# Patient Record
Sex: Female | Born: 2000 | Hispanic: Yes | Marital: Married | Smoking: Never smoker
Health system: Southern US, Community
[De-identification: ages and names within clinical notes are randomized; demographics above are authoritative.]

## PROBLEM LIST (undated history)

## (undated) ENCOUNTER — Ambulatory Visit: Admission: EM | Source: Home / Self Care

## (undated) DIAGNOSIS — Z789 Other specified health status: Secondary | ICD-10-CM

## (undated) DIAGNOSIS — G43909 Migraine, unspecified, not intractable, without status migrainosus: Secondary | ICD-10-CM

## (undated) DIAGNOSIS — N83209 Unspecified ovarian cyst, unspecified side: Secondary | ICD-10-CM

## (undated) HISTORY — PX: NO PAST SURGERIES: SHX2092

## (undated) HISTORY — PX: OTHER SURGICAL HISTORY: SHX169

## (undated) HISTORY — DX: Other specified health status: Z78.9

## (undated) HISTORY — DX: Migraine, unspecified, not intractable, without status migrainosus: G43.909

---
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
## 2019-04-23 ENCOUNTER — Other Ambulatory Visit: Payer: Self-pay

## 2019-04-23 DIAGNOSIS — Z20828 Contact with and (suspected) exposure to other viral communicable diseases: Secondary | ICD-10-CM

## 2019-04-23 DIAGNOSIS — Z20822 Contact with and (suspected) exposure to covid-19: Secondary | ICD-10-CM

## 2019-04-25 ENCOUNTER — Telehealth: Payer: Self-pay | Admitting: General Practice

## 2019-04-25 LAB — NOVEL CORONAVIRUS, NAA: SARS-CoV-2, NAA: NOT DETECTED

## 2019-04-25 NOTE — Telephone Encounter (Signed)
Negative COVID results given. Patient results "NOT Detected." Caller expressed understanding. ° °

## 2019-04-26 ENCOUNTER — Telehealth: Payer: Self-pay | Admitting: General Practice

## 2019-04-26 NOTE — Telephone Encounter (Signed)
°  Neg COVID results given to pt  °

## 2019-08-13 ENCOUNTER — Other Ambulatory Visit
Admission: RE | Admit: 2019-08-13 | Discharge: 2019-08-13 | Disposition: A | Discharge status: Home / Self Care | Payer: Medicaid Other | Source: Ambulatory Visit | Attending: Pediatrics | Admitting: Pediatrics

## 2019-08-13 DIAGNOSIS — L659 Nonscarring hair loss, unspecified: Secondary | ICD-10-CM | POA: Diagnosis present

## 2019-08-13 LAB — CBC WITH DIFFERENTIAL/PLATELET
Abs Immature Granulocytes: 0.02 10*3/uL (ref 0.00–0.07)
Basophils Absolute: 0.1 10*3/uL (ref 0.0–0.1)
Basophils Relative: 1 %
Eosinophils Absolute: 0.1 10*3/uL (ref 0.0–0.5)
Eosinophils Relative: 2 %
HCT: 33.6 % — ABNORMAL LOW (ref 36.0–46.0)
Hemoglobin: 10.4 g/dL — ABNORMAL LOW (ref 12.0–15.0)
Immature Granulocytes: 0 %
Lymphocytes Relative: 26 %
Lymphs Abs: 2 10*3/uL (ref 0.7–4.0)
MCH: 26 pg (ref 26.0–34.0)
MCHC: 31 g/dL (ref 30.0–36.0)
MCV: 84 fL (ref 80.0–100.0)
Monocytes Absolute: 0.6 10*3/uL (ref 0.1–1.0)
Monocytes Relative: 8 %
Neutro Abs: 4.8 10*3/uL (ref 1.7–7.7)
Neutrophils Relative %: 63 %
Platelets: 306 10*3/uL (ref 150–400)
RBC: 4 MIL/uL (ref 3.87–5.11)
RDW: 15.2 % (ref 11.5–15.5)
WBC: 7.5 10*3/uL (ref 4.0–10.5)
nRBC: 0 % (ref 0.0–0.2)

## 2019-08-13 LAB — IRON AND TIBC
Iron: 71 ug/dL (ref 28–170)
Saturation Ratios: 16 % (ref 10.4–31.8)
TIBC: 449 ug/dL (ref 250–450)
UIBC: 378 ug/dL

## 2019-08-13 LAB — TSH: TSH: 0.715 u[IU]/mL (ref 0.350–4.500)

## 2019-08-13 LAB — FERRITIN: Ferritin: 3 ng/mL — ABNORMAL LOW (ref 11–307)

## 2019-10-08 ENCOUNTER — Encounter: Payer: Self-pay | Admitting: Certified Nurse Midwife

## 2019-10-08 ENCOUNTER — Other Ambulatory Visit: Payer: Self-pay

## 2019-10-08 ENCOUNTER — Ambulatory Visit (INDEPENDENT_AMBULATORY_CARE_PROVIDER_SITE_OTHER): Payer: Medicaid Other | Admitting: Certified Nurse Midwife

## 2019-10-08 VITALS — BP 110/72 | HR 84 | Ht 64.0 in | Wt 140.0 lb

## 2019-10-08 DIAGNOSIS — Z30016 Encounter for initial prescription of transdermal patch hormonal contraceptive device: Secondary | ICD-10-CM | POA: Diagnosis not present

## 2019-10-08 LAB — POCT URINE PREGNANCY: Preg Test, Ur: NEGATIVE

## 2019-10-08 MED ORDER — XULANE 150-35 MCG/24HR TD PTWK: 1.0000 | MEDICATED_PATCH | TRANSDERMAL | 4 refills | Status: DC

## 2019-10-08 NOTE — Patient Instructions (Signed)
Preventive Care 18-21 Years Old, Female Preventive care refers to lifestyle choices and visits with your health care provider that can promote health and wellness. At this stage in your life, you may start seeing a primary care physician instead of a pediatrician. Your health care is now your responsibility. Preventive care for young adults includes:  A yearly physical exam. This is also called an annual wellness visit.  Regular dental and eye exams.  Immunizations.  Screening for certain conditions.  Healthy lifestyle choices, such as diet and exercise. What can I expect for my preventive care visit? Physical exam Your health care provider may check:  Height and weight. These may be used to calculate body mass index (BMI), which is a measurement that tells if you are at a healthy weight.  Heart rate and blood pressure.  Body temperature. Counseling Your health care provider may ask you questions about:  Past medical problems and family medical history.  Alcohol, tobacco, and drug use.  Home and relationship well-being.  Access to firearms.  Emotional well-being.  Diet, exercise, and sleep habits.  Sexual activity and sexual health.  Method of birth control.  Menstrual cycle.  Pregnancy history. What immunizations do I need?  Influenza (flu) vaccine  This is recommended every year. Tetanus, diphtheria, and pertussis (Tdap) vaccine  You may need a Td booster every 10 years. Varicella (chickenpox) vaccine  You may need this vaccine if you have not already been vaccinated. Human papillomavirus (HPV) vaccine  If recommended by your health care provider, you may need three doses over 6 months. Measles, mumps, and rubella (MMR) vaccine  You may need at least one dose of MMR. You may also need a second dose. Meningococcal conjugate (MenACWY) vaccine  One dose is recommended if you are 19-21 years old and a first-year college student living in a residence hall,  or if you have one of several medical conditions. You may also need additional booster doses. Pneumococcal conjugate (PCV13) vaccine  You may need this if you have certain conditions and were not previously vaccinated. Pneumococcal polysaccharide (PPSV23) vaccine  You may need one or two doses if you smoke cigarettes or if you have certain conditions. Hepatitis A vaccine  You may need this if you have certain conditions or if you travel or work in places where you may be exposed to hepatitis A. Hepatitis B vaccine  You may need this if you have certain conditions or if you travel or work in places where you may be exposed to hepatitis B. Haemophilus influenzae type b (Hib) vaccine  You may need this if you have certain risk factors. You may receive vaccines as individual doses or as more than one vaccine together in one shot (combination vaccines). Talk with your health care provider about the risks and benefits of combination vaccines. What tests do I need? Blood tests  Lipid and cholesterol levels. These may be checked every 5 years starting at age 20.  Hepatitis C test.  Hepatitis B test. Screening  Pelvic exam and Pap test. This may be done every 3 years starting at age 21.  Sexually transmitted disease (STD) testing, if you are at risk.  BRCA-related cancer screening. This may be done if you have a family history of breast, ovarian, tubal, or peritoneal cancers. Other tests  Tuberculosis skin test.  Vision and hearing tests.  Skin exam.  Breast exam. Follow these instructions at home: Eating and drinking   Eat a diet that includes fresh fruits and   vegetables, whole grains, lean protein, and low-fat dairy products.  Drink enough fluid to keep your urine pale yellow.  Do not drink alcohol if: ? Your health care provider tells you not to drink. ? You are pregnant, may be pregnant, or are planning to become pregnant. ? You are under the legal drinking age. In the  [place] the legal drinking age is 36.  If you drink alcohol: ? Limit how much you have to 0-1 drink a day. ? Be aware of how much alcohol is in your drink. In the [place] one drink equals one 12 oz bottle of beer (355 mL), one 5 oz glass of wine (148 mL), or one 1 oz glass of hard liquor (44 mL). Lifestyle  Take daily care of your teeth and gums.  Stay active. Exercise at least 30 minutes 5 or more days of the week.  Do not use any products that contain nicotine or tobacco, such as cigarettes, e-cigarettes, and chewing tobacco. If you need help quitting, ask your health care provider.  Do not use drugs.  If you are sexually active, practice safe sex. Use a condom or other form of birth control (contraception) in order to prevent pregnancy and STIs (sexually transmitted infections). If you plan to become pregnant, see your health care provider for a pre-conception visit.  Find healthy ways to cope with stress, such as: ? Meditation, yoga, or listening to music. ? Journaling. ? Talking to a trusted person. ? Spending time with friends and family. Safety  Always wear your seat belt while driving or riding in a vehicle.  Do not drive if you have been drinking alcohol. Do not ride with someone who has been drinking.  Do not drive when you are tired or distracted. Do not text while driving.  Wear a helmet and other protective equipment during sports activities.  If you have firearms in your house, make sure you follow all gun safety procedures.  Seek help if you have been bullied, physically abused, or sexually abused.  Use the Internet responsibly to avoid dangers such as online bullying and online sex predators. What's next?  Go to your health care provider once a year for a well check visit.  Ask your health care provider how often you should have your eyes and teeth checked.  Stay up to date on all vaccines. This information is not intended to replace advice given to you by  your health care provider. Make sure you discuss any questions you have with your health care provider. Document Revised: 06/29/2018 Document Reviewed: 06/29/2018 Elsevier Patient Education  [place].   Ethinyl Estradiol; Norelgestromin skin patches What is this medicine? ETHINYL ESTRADIOL;NORELGESTROMIN (ETH in il es tra DYE ole; nor el JES troe min) skin patch is used as a contraceptive (birth control method). This medicine combines two types of female hormones, an estrogen and a progestin. This patch is used to prevent ovulation and pregnancy. This medicine may be used for other purposes; ask your health care provider or pharmacist if you have questions. COMMON BRAND NAME(S): Ortho Becky Sax What should I tell my health care provider before I take this medicine? They need to know if you have or ever had any of these conditions:  abnormal vaginal bleeding  blood vessel disease or blood clots  breast, cervical, endometrial, ovarian, liver, or uterine cancer  diabetes  gallbladder disease  having surgery  heart disease or recent heart attack  high blood pressure  high cholesterol or triglycerides  history of irregular heartbeat or heart valve problems  kidney disease  liver disease  migraine headaches  protein C deficiency  protein S deficiency  recently had a baby, miscarriage, or abortion  stroke  systemic lupus erythematosus (SLE)  tobacco smoker  an unusual or allergic reaction to estrogens, progestins, other medicines, foods, dyes, or preservatives  pregnant or trying to get pregnant  breast-feeding How should I use this medicine? This patch is applied to the skin. Follow the directions on the prescription label. Apply to clean, dry, healthy skin on the buttock, abdomen, upper outer arm or upper torso, in a place where it will not be rubbed by tight clothing. Do not use lotions or other cosmetics on the site where the patch will go. Press the  patch firmly in place for 10 seconds to ensure good contact with the skin. Change the patch every 7 days on the same day of the week for 3 weeks. You will then have a break from the patch for 1 week, after which you will apply a new patch. Do not use your medicine more often than directed. Contact your pediatrician regarding the use of this medicine in children. Special care may be needed. This medicine has been used in female children who have started having menstrual periods. A patient package insert for the product will be given with each prescription and refill. Read this sheet carefully each time. The sheet may change frequently. Overdosage: If you think you have taken too much of this medicine contact a poison control center or emergency room at once. NOTE: This medicine is only for you. Do not share this medicine with others. What if I miss a dose? You will need to replace your patch once a week as directed. If your patch is lost or falls off, contact your health care professional for advice. You may need to use another form of birth control if your patch has been off for more than 1 day. What may interact with this medicine? Do not take this medicine with the following medications:  dasabuvir; ombitasvir; paritaprevir; ritonavir  ombitasvir; paritaprevir; ritonavir This medicine may also interact with the following medications:  acetaminophen  antibiotics or medicines for infections, especially rifampin, rifabutin, rifapentine, and possibly penicillins or tetracyclines  aprepitant or fosaprepitant  armodafinil  ascorbic acid (vitamin C)  barbiturate medicines, such as phenobarbital or primidone  bosentan  certain antiviral medicines for hepatitis, HIV or AIDS  certain medicines for cancer treatment  certain medicines for seizures like carbamazepine, clobazam, felbamate, lamotrigine, oxcarbazepine, phenytoin, rufinamide, topiramate  certain medicines for treating high  cholesterol  cyclosporine  dantrolene  elagolix  flibanserin  grapefruit juice  lesinurad  medicines for diabetes  medicines to treat fungal infections, such as griseofulvin, miconazole, fluconazole, ketoconazole, itraconazole, posaconazole or voriconazole  mifepristone  mitotane  modafinil  morphine  mycophenolate  St. John's wort  tamoxifen  temazepam  theophylline or aminophylline  thyroid hormones  tizanidine  tranexamic acid  ulipristal  warfarin This list may not describe all possible interactions. Give your health care provider a list of all the medicines, herbs, non-prescription drugs, or dietary supplements you use. Also tell them if you smoke, drink alcohol, or use illegal drugs. Some items may interact with your medicine. What should I watch for while using this medicine? Visit your doctor or health care professional for regular checks on your progress. You will need a regular breast and pelvic exam and Pap smear while on this medicine. Use an additional  method of contraception during the first cycle that you use this patch. If you have any reason to think you are pregnant, stop using this medicine right away and contact your doctor or health care professional. If you are using this medicine for hormone related problems, it may take several cycles of use to see improvement in your condition. Smoking increases the risk of getting a blood clot or having a stroke while you are using hormonal birth control, especially if you are more than 20 years old. You are strongly advised not to smoke. This medicine can make your body retain fluid, making your fingers, hands, or ankles swell. Your blood pressure can go up. Contact your doctor or health care professional if you feel you are retaining fluid. This medicine can make you more sensitive to the sun. Keep out of the sun. If you cannot avoid being in the sun, wear protective clothing and use sunscreen. Do not  use sun lamps or tanning beds/booths. If you wear contact lenses and notice visual changes, or if the lenses begin to feel uncomfortable, consult your eye care specialist. In some women, tenderness, swelling, or minor bleeding of the gums may occur. Notify your dentist if this happens. Brushing and flossing your teeth regularly may help limit this. See your dentist regularly and inform your dentist of the medicines you are taking. If you are going to have elective surgery or a MRI, you may need to stop using this medicine before the surgery or MRI. Consult your health care professional for advice. This medicine does not protect you against HIV infection (AIDS) or any other sexually transmitted diseases. What side effects may I notice from receiving this medicine? Side effects that you should report to your doctor or health care professional as soon as possible:  allergic reactions such as skin rash or itching, hives, swelling of the lips, mouth, tongue, or throat  breast tissue changes or discharge  dark patches of skin on your forehead, cheeks, upper lip, and chin  depression  high blood pressure  migraines or severe, sudden headaches  missed menstrual periods  signs and symptoms of a blood clot such as breathing problems; changes in vision; chest pain; severe, sudden headache; pain, swelling, warmth in the leg; trouble speaking; sudden numbness or weakness of the face, arm or leg  skin reactions at the patch site such as blistering, bleeding, itching, rash, or swelling  stomach pain  yellowing of the eyes or skin Side effects that usually do not require medical attention (report these to your doctor or health care professional if they continue or are bothersome):  breast tenderness  irregular vaginal bleeding or spotting, particularly during the first 3 months of use  headache  nausea  painful menstrual periods  skin redness or mild irritation at site where  applied  weight gain (slight) This list may not describe all possible side effects. Call your doctor for medical advice about side effects. You may report side effects to FDA at 1-800-FDA-1088. Where should I keep my medicine? Keep out of the reach of children. Store at room temperature between 15 and 30 degrees C (59 and 86 degrees F). Keep the patch in its pouch until time of use. Throw away any unused medicine after the expiration date. Dispose of used patches properly. Since a used patch may still contain active hormones, fold the patch in half so that it sticks to itself prior to disposal. Throw away in a place where children or pets cannot reach.  NOTE: This sheet is a summary. It may not cover all possible information. If you have questions about this medicine, talk to your doctor, pharmacist, or health care provider.  2020 Elsevier/Gold Standard (2018-10-10 11:56:29)

## 2019-10-08 NOTE — Progress Notes (Signed)
NP-Pt present to discuss birth control. Pt stated that she would like to start OCP. Pt is currently sexually activity and using condoms for birth control. Pt LMP 09/27/2019. UPT- NEG.

## 2019-10-08 NOTE — Progress Notes (Signed)
GYN ENCOUNTER NOTE  Subjective:       Jenalyn Girdner is a 19 y.o. G0P0000 female here for contraception counseling.  Desires pregnancy prevention and period management.   History significant for tension like headaches followed by [facility].   Denies difficulty breathing or respiratory distress, chest pain, abdominal pain, excessive vaginal bleeding, dysuria, and leg pain or swelling.      Gynecologic History  Patient's last menstrual period was 09/27/2019. Period Cycle (Days): 28 Period Duration (Days): 4 Period Pattern: Regular Menstrual Flow: Moderate Menstrual Control: Maxi pad Dysmenorrhea: (!) Mild Dysmenorrhea Symptoms: Cramping  Contraception: condoms   Last Pap:N/A  Obstetric History  OB History  Gravida Para Term Preterm AB Living  0 0 0 0 0 0  SAB TAB Ectopic Multiple Live Births  0 0 0 0 0    Past Medical History:  Diagnosis Date  . Migraines     No Known Allergies  Social History   Socioeconomic History  . Marital status: Single    Spouse name: Not on file  . Number of children: Not on file  . Years of education: Not on file  . Highest education level: Not on file  Occupational History  . Not on file  Tobacco Use  . Smoking status: Never Smoker  . Smokeless tobacco: Never Used  Substance and Sexual Activity  . Alcohol use: Never  . Drug use: Never  . Sexual activity: Yes    Birth control/protection: Condom  Other Topics Concern  . Not on file  Social History Narrative  . Not on file   Social Determinants of Health   Financial Resource Strain:   . Difficulty of Paying Living Expenses:   Food Insecurity:   . Worried About Charity fundraiser in the Last Year:   . Arboriculturist in the Last Year:   Transportation Needs:   . Film/video editor (Medical):   Marland Kitchen Lack of Transportation (Non-Medical):   Physical Activity:   . Days of Exercise per Week:   . Minutes of Exercise per Session:   Stress:   . Feeling of Stress :    Social Connections:   . Frequency of Communication with Friends and Family:   . Frequency of Social Gatherings with Friends and Family:   . Attends Religious Services:   . Active Member of Clubs or Organizations:   . Attends Archivist Meetings:   Marland Kitchen Marital Status:   Intimate Partner Violence:   . Fear of Current or Ex-Partner:   . Emotionally Abused:   Marland Kitchen Physically Abused:   . Sexually Abused:     Family History  Problem Relation Age of Onset  . Migraines Mother   . Anxiety disorder Father   . Migraines Maternal Grandmother   . Healthy Paternal Grandmother   . Healthy Paternal Grandfather     The following portions of the patient's history were reviewed and updated as appropriate: allergies, current medications, past family history, past medical history, past social history, past surgical history and problem list.  Review of Systems  ROS negative except as noted above. Information obtained from patient.   Objective:   BP 110/72   Pulse 84   Ht 5\' 4"  (1.626 m)   Wt 140 lb (63.5 kg)   LMP 09/27/2019   BMI 24.03 kg/m    CONSTITUTIONAL: Well-developed, well-nourished female in no acute distress.   PHYSICAL EXAM: Not indicated.   POCT urine pregnancy     Status:  None   Collection Time: 10/08/19 10:25 AM  Result Value Ref Range   Preg Test, Ur Negative Negative    Assessment:   1. Encounter for initial prescription of transdermal patch hormonal contraceptive device  - POCT urine pregnancy     Plan:   Reviewed all forms of birth control options available including abstinence; fertility period awareness methods; over the counter/barrier methods; hormonal contraceptive medication including pill, patch, ring, injection,contraceptive implant; hormonal and nonhormonal IUDs; permanent sterilization options including vasectomy and the various tubal sterilization modalities. Risks and benefits reviewed.  Questions were answered.  Information was given to  patient to review.   Rx [facility], see orders.   Reviewed red flag symptoms and when to call.   RTC x 1 year for ANNUAL EXAM or sooner if needed.    Gunnar Bulla, CNM [facility] 10/08/19 11:24 AM    A total of 20 minutes were spent face-to-face with the patient during this encounter and over half of that time involved counseling and coordination of care.

## 2020-01-28 ENCOUNTER — Ambulatory Visit
Admission: EM | Admit: 2020-01-28 | Discharge: 2020-01-28 | Disposition: A | Discharge status: Home / Self Care | Payer: Medicaid Other | Attending: Family Medicine | Admitting: Family Medicine

## 2020-01-28 ENCOUNTER — Other Ambulatory Visit: Payer: Self-pay

## 2020-01-28 DIAGNOSIS — R5383 Other fatigue: Secondary | ICD-10-CM | POA: Insufficient documentation

## 2020-01-28 LAB — CBC WITH DIFFERENTIAL/PLATELET
Abs Immature Granulocytes: 0.02 10*3/uL (ref 0.00–0.07)
Basophils Absolute: 0 10*3/uL (ref 0.0–0.1)
Basophils Relative: 0 %
Eosinophils Absolute: 0.1 10*3/uL (ref 0.0–0.5)
Eosinophils Relative: 2 %
HCT: 35.3 % — ABNORMAL LOW (ref 36.0–46.0)
Hemoglobin: 11.7 g/dL — ABNORMAL LOW (ref 12.0–15.0)
Immature Granulocytes: 0 %
Lymphocytes Relative: 16 %
Lymphs Abs: 1.1 10*3/uL (ref 0.7–4.0)
MCH: 27.3 pg (ref 26.0–34.0)
MCHC: 33.1 g/dL (ref 30.0–36.0)
MCV: 82.5 fL (ref 80.0–100.0)
Monocytes Absolute: 0.6 10*3/uL (ref 0.1–1.0)
Monocytes Relative: 9 %
Neutro Abs: 5 10*3/uL (ref 1.7–7.7)
Neutrophils Relative %: 73 %
Platelets: 270 10*3/uL (ref 150–400)
RBC: 4.28 MIL/uL (ref 3.87–5.11)
RDW: 15.8 % — ABNORMAL HIGH (ref 11.5–15.5)
WBC: 6.9 10*3/uL (ref 4.0–10.5)
nRBC: 0 % (ref 0.0–0.2)

## 2020-01-28 LAB — URINALYSIS, COMPLETE (UACMP) WITH MICROSCOPIC
Bilirubin Urine: NEGATIVE
Glucose, UA: NEGATIVE mg/dL
Ketones, ur: NEGATIVE mg/dL
Leukocytes,Ua: NEGATIVE
Nitrite: NEGATIVE
Protein, ur: NEGATIVE mg/dL
Specific Gravity, Urine: >1.03 — ABNORMAL HIGH (ref 1.005–1.030)
pH: 6.5 (ref 5.0–8.0)

## 2020-01-28 LAB — PREGNANCY, URINE: Preg Test, Ur: NEGATIVE

## 2020-01-28 LAB — BASIC METABOLIC PANEL
Anion gap: 7 (ref 5–15)
BUN: 10 mg/dL (ref 6–20)
CO2: 25 mmol/L (ref 22–32)
Calcium: 8.9 mg/dL (ref 8.9–10.3)
Chloride: 104 mmol/L (ref 98–111)
Creatinine, Ser: 0.6 mg/dL (ref 0.44–1.00)
GFR calc Af Amer: >60 mL/min (ref >60)
GFR calc non Af Amer: >60 mL/min (ref >60)
Glucose, Bld: 95 mg/dL (ref 70–99)
Potassium: 4 mmol/L (ref 3.5–5.1)
Sodium: 136 mmol/L (ref 135–145)

## 2020-01-28 LAB — TSH: TSH: 0.717 u[IU]/mL (ref 0.350–4.500)

## 2020-01-28 NOTE — ED Triage Notes (Signed)
Pt states for past week she has felt run-down and tired. Not feeling sick.

## 2020-01-28 NOTE — ED Provider Notes (Signed)
[facility] ____________________________________________  Time seen: Approximately 11:04 AM  I have reviewed the triage vital signs and the nursing notes.   HISTORY  Chief Complaint Fatigue   HPI Claudia Frederick is a 19 y.o. female presenting  for evaluation evaluation of fatigue.  Patient reports for the last 1 week she has been feeling tired and rundown.  Patient does report she has had a lot of stress this past week, particularly with work.  Denies any suicidal homicidal ideations.  Continues eat and drink well.  Denies recent sickness.  Denies cough, congestion, sore throat, vomiting, diarrhea, chest pain or shortness of breath, abdominal pain.  Denies sudden abrupt weight loss, but does report she has lost 11 pounds in the last 3 months with working more.  Recently had left arm from an implant placed, denies pregnancy.  Denies dysuria.  Patient further states she had this placed in May, and requested a if it could be removed.  Patient again states at this time she does not have any pain and feels overall well just tired.  Corky Downs, NP : PCP Patient's last menstrual period was 11/28/2019.   Past Medical History:  Diagnosis Date   Migraines     There are no problems to display for this patient.   Past Surgical History:  Procedure Laterality Date   no surgical history       No current facility-administered medications for this encounter.  Current Outpatient Medications:    norelgestromin-ethinyl estradiol [employer]) 150-35 MCG/24HR transdermal patch, Place 1 patch onto the skin once a week., Disp: 9 patch, Rfl: 4  Allergies Patient has no known allergies.  Family History  Problem Relation Age of Onset   Migraines Mother    Anxiety disorder Father    Migraines Maternal Grandmother    Healthy Paternal Grandmother    Healthy Paternal Grandfather     Social History Social History   Tobacco Use   Smoking status: Never Smoker    Smokeless tobacco: Never Used  Building services engineer Use: Never used  Substance Use Topics   Alcohol use: Never   Drug use: Never    Review of Systems Constitutional: No fever ENT: No sore throat. Cardiovascular: Denies chest pain. Respiratory: Denies shortness of breath. Gastrointestinal: No abdominal pain.  No nausea, no vomiting.  No diarrhea.  No constipation. Genitourinary: Negative for dysuria. Musculoskeletal: Negative for back pain. Skin: Negative for rash. Neurological: Negative for headaches, focal weakness or numbness.   ____________________________________________   PHYSICAL EXAM:  VITAL SIGNS: ED Triage Vitals  Enc Vitals Group     BP 01/28/20 1026 113/81     Pulse Rate 01/28/20 1026 73     Resp 01/28/20 1026 16     Temp 01/28/20 1026 98.2 F (36.8 C)     Temp Source 01/28/20 1026 Oral     SpO2 01/28/20 1026 100 %     Weight 01/28/20 1025 129 lb 6.4 oz (58.7 kg)     Height 01/28/20 1025 5\' 3"  (1.6 m)     Head Circumference --      Peak Flow --      Pain Score 01/28/20 1025 0     Pain Loc --      Pain Edu? --      Excl. in GC? --     Constitutional: Alert and oriented. Well appearing and in no acute distress. Eyes: Conjunctivae are normal.  ENT      Head: Normocephalic and atraumatic.  Neck: No stridor. Supple without meningismus.  Hematological/Lymphatic/Immunilogical: No cervical lymphadenopathy. Cardiovascular: Normal rate, regular rhythm. Grossly normal heart sounds.  Good peripheral circulation. Respiratory: Normal respiratory effort without tachypnea nor retractions. Breath sounds are clear and equal bilaterally. No wheezes, rales, rhonchi. Gastrointestinal: Soft and nontender.  Musculoskeletal: Steady gait. Neurologic:  Normal speech and language. Speech is normal. No gait instability.  Skin:  Skin is warm, dry and intact. No rash noted. Psychiatric: Mood and affect are normal. Speech and behavior are normal. Patient exhibits appropriate  insight and judgment   ___________________________________________   LABS (all labs ordered are listed, but only abnormal results are displayed)  Labs Reviewed  URINALYSIS, COMPLETE (UACMP) WITH MICROSCOPIC - Abnormal; Notable for the following components:      Result Value   Specific Gravity, Urine >1.030 (*)    Hgb urine dipstick SMALL (*)    Bacteria, UA RARE (*)    All other components within normal limits  CBC WITH DIFFERENTIAL/PLATELET - Abnormal; Notable for the following components:   Hemoglobin 11.7 (*)    HCT 35.3 (*)    RDW 15.8 (*)    All other components within normal limits  PREGNANCY, URINE  BASIC METABOLIC PANEL  TSH   ____________________________________________    PROCEDURES  INITIAL IMPRESSION / ASSESSMENT AND PLAN / ED COURSE  Pertinent labs & imaging results that were available during my care of the patient were reviewed by me and considered in my medical decision making (see chart for details).  Well-appearing patient.  No acute distress.  Labs as above reviewed, and discussed with patient.  Encourage patient to take regular multivitamin with iron.  Suspect patient recent stress relating to her fatigue.  Encourage close primary care follow-up.  Supportive care.  Discussed follow up with Primary care physician this week. Discussed follow up and return parameters including no resolution or any worsening concerns. Patient verbalized understanding and agreed to plan.   ____________________________________________   FINAL CLINICAL IMPRESSION(S) / ED DIAGNOSES  Final diagnoses:  Fatigue, unspecified type     ED Discharge Orders    None       Note: This dictation was prepared with Dragon dictation along with smaller phrase technology. Any transcriptional errors that result from this process are unintentional.      a   Renford Dills, NP 01/28/20 1236

## 2020-01-28 NOTE — Discharge Instructions (Addendum)
Rest. Drink plenty of fluids. Avoid stress as able.   Follow up with your primary care physician this week.  Return to Urgent care for new or worsening concerns.

## 2020-02-25 ENCOUNTER — Ambulatory Visit
Admission: EM | Admit: 2020-02-25 | Discharge: 2020-02-25 | Disposition: A | Discharge status: Home / Self Care | Payer: Medicaid Other | Attending: Family Medicine | Admitting: Family Medicine

## 2020-02-25 ENCOUNTER — Other Ambulatory Visit: Payer: Self-pay

## 2020-02-25 DIAGNOSIS — R1013 Epigastric pain: Secondary | ICD-10-CM | POA: Diagnosis not present

## 2020-02-25 MED ORDER — PANTOPRAZOLE SODIUM 20 MG PO TBEC
40.0000 mg | DELAYED_RELEASE_TABLET | Freq: Every day | ORAL | 1 refills | Status: DC
Start: 2020-02-25 — End: 2020-03-17

## 2020-02-25 NOTE — ED Triage Notes (Signed)
Pt with mid/upper abdominal pain for past 2 days always after she eats. Usually takes an hour to go a way. No n/v/d.

## 2020-02-25 NOTE — Discharge Instructions (Addendum)
Medication as prescribed.  Healthy diet.   If persists, will need to see GI.  Take care  Dr. Adriana Simas

## 2020-02-25 NOTE — ED Provider Notes (Signed)
[facility]    CSN: [id number] Arrival date & time: 02/25/20  1553      History   Chief Complaint Chief Complaint  Patient presents with  . Abdominal Pain   HPI   19 year old female presents with upper abdominal pain.  2-day history of upper abdominal pain.  Pain 3/10 in severity.  Worse after eating.  Denies any nausea, vomiting, diarrhea.  Denies heartburn.  States that her last menstrual period was in May but she has a Nexplanon.  Seems to improve approximately 1 hour after eating.  No right upper quadrant pain.  No relieving factors.  No medications or interventions tried.  No other associated symptoms.  No other complaints.  Past Medical History:  Diagnosis Date  . Migraines    Past Surgical History:  Procedure Laterality Date  . no surgical history      OB History    Gravida  0   Para  0   Term  0   Preterm  0   AB  0   Living  0     SAB  0   TAB  0   Ectopic  0   Multiple  0   Live Births  0            Home Medications    Prior to Admission medications   Medication Sig Start Date End Date Taking? Authorizing Provider  etonogestrel (NEXPLANON) 68 MG IMPL implant 1 each by Subdermal route once.   Yes [provider]  pantoprazole (PROTONIX) 20 MG tablet Take 2 tablets (40 mg total) by mouth daily. 02/25/20   Tommie Sams, DO  norelgestromin-ethinyl estradiol [employer]) 150-35 MCG/24HR transdermal patch Place 1 patch onto the skin once a week. 10/08/19 02/25/20  Gunnar Bulla, CNM    Family History Family History  Problem Relation Age of Onset  . Migraines Mother   . Anxiety disorder Father   . Migraines Maternal Grandmother   . Healthy Paternal Grandmother   . Healthy Paternal Grandfather     Social History Social History   Tobacco Use  . Smoking status: Never Smoker  . Smokeless tobacco: Never Used  Vaping Use  . Vaping Use: Never used  Substance Use Topics  . Alcohol use: Never  . Drug use: Never      Allergies   Patient has no known allergies.   Review of Systems Review of Systems  Constitutional: Negative for fever.  Gastrointestinal: Positive for abdominal pain. Negative for diarrhea, nausea and vomiting.   Physical Exam Triage Vital Signs ED Triage Vitals  Enc Vitals Group     BP 02/25/20 1634 108/75     Pulse Rate 02/25/20 1634 85     Resp 02/25/20 1634 16     Temp 02/25/20 1634 98.2 F (36.8 C)     Temp Source 02/25/20 1634 Oral     SpO2 02/25/20 1634 100 %     Weight 02/25/20 1633 129 lb 3 oz (58.6 kg)     Height 02/25/20 1633 5\' 3"  (1.6 m)     Head Circumference --      Peak Flow --      Pain Score 02/25/20 1633 3     Pain Loc --      Pain Edu? --      Excl. in GC? --    Updated Vital Signs BP 108/75 (BP Location: Left Arm)   Pulse 85   Temp 98.2 F (36.8 C) (Oral)  Resp 16   Ht 5\' 3"  (1.6 m)   Wt 58.6 kg   LMP 11/25/2019   SpO2 100%   BMI 22.88 kg/m   Visual Acuity Right Eye Distance:   Left Eye Distance:   Bilateral Distance:    Right Eye Near:   Left Eye Near:    Bilateral Near:     Physical Exam Vitals and nursing note reviewed.  Constitutional:      General: She is not in acute distress.    Appearance: Normal appearance. She is well-developed. She is not ill-appearing.  HENT:     Head: Normocephalic and atraumatic.  Eyes:     General:        Right eye: No discharge.        Left eye: No discharge.     Conjunctiva/sclera: Conjunctivae normal.  Cardiovascular:     Rate and Rhythm: Normal rate and regular rhythm.  Pulmonary:     Effort: Pulmonary effort is normal.     Breath sounds: Normal breath sounds. No wheezing, rhonchi or rales.  Abdominal:     General: There is no distension.     Palpations: Abdomen is soft.     Comments: Mild tenderness in the epigastric region.  Neurological:     Mental Status: She is alert.  Psychiatric:        Mood and Affect: Mood normal.        Behavior: Behavior normal.    UC Treatments  / Results  Labs (all labs ordered are listed, but only abnormal results are displayed) Labs Reviewed - No data to display  EKG   Radiology No results found.  Procedures Procedures (including critical care time)  Medications Ordered in UC Medications - No data to display  Initial Impression / Assessment and Plan / UC Course  I have reviewed the triage vital signs and the nursing notes.  Pertinent labs & imaging results that were available during my care of the patient were reviewed by me and considered in my medical decision making (see chart for details).     19 year old female presents with epigastric pain.  GERD versus gastritis.  Protonix as directed.  If persist, will need to see GI.  Final Clinical Impressions(s) / UC Diagnoses   Final diagnoses:  Epigastric pain     Discharge Instructions     Medication as prescribed.  Healthy diet.   If persists, will need to see GI.  Take care  Dr. 12     ED Prescriptions    Medication Sig Dispense Auth. Provider   pantoprazole (PROTONIX) 20 MG tablet Take 2 tablets (40 mg total) by mouth daily. 30 tablet Adriana Simas, DO     PDMP not reviewed this encounter.   Tommie Sams, Tommie Sams 02/25/20 1719

## 2020-03-03 ENCOUNTER — Ambulatory Visit: Payer: Self-pay | Admitting: Certified Nurse Midwife

## 2020-03-17 ENCOUNTER — Ambulatory Visit (INDEPENDENT_AMBULATORY_CARE_PROVIDER_SITE_OTHER): Payer: Medicaid Other | Admitting: Certified Nurse Midwife

## 2020-03-17 ENCOUNTER — Encounter: Payer: Self-pay | Admitting: Certified Nurse Midwife

## 2020-03-17 ENCOUNTER — Other Ambulatory Visit: Payer: Self-pay

## 2020-03-17 VITALS — BP 107/72 | HR 79 | Ht 63.0 in | Wt 134.5 lb

## 2020-03-17 DIAGNOSIS — Z975 Presence of (intrauterine) contraceptive device: Secondary | ICD-10-CM

## 2020-03-17 DIAGNOSIS — N921 Excessive and frequent menstruation with irregular cycle: Secondary | ICD-10-CM | POA: Diagnosis not present

## 2020-03-17 NOTE — Patient Instructions (Signed)
Etonogestrel implant What is this medicine? ETONOGESTREL (et oh noe JES trel) is a contraceptive (birth control) device. It is used to prevent pregnancy. It can be used for up to 3 years. This medicine may be used for other purposes; ask your health care provider or pharmacist if you have questions. COMMON BRAND NAME(S): Implanon, Nexplanon What should I tell my health care provider before I take this medicine? They need to know if you have any of these conditions:  abnormal vaginal bleeding  blood vessel disease or blood clots  breast, cervical, endometrial, ovarian, liver, or uterine cancer  diabetes  gallbladder disease  heart disease or recent heart attack  high blood pressure  high cholesterol or triglycerides  kidney disease  liver disease  migraine headaches  seizures  stroke  tobacco smoker  an unusual or allergic reaction to etonogestrel, anesthetics or antiseptics, other medicines, foods, dyes, or preservatives  pregnant or trying to get pregnant  breast-feeding How should I use this medicine? This device is inserted just under the skin on the inner side of your upper arm by a health care professional. Talk to your pediatrician regarding the use of this medicine in children. Special care may be needed. Overdosage: If you think you have taken too much of this medicine contact a poison control center or emergency room at once. NOTE: This medicine is only for you. Do not share this medicine with others. What if I miss a dose? This does not apply. What may interact with this medicine? Do not take this medicine with any of the following medications:  amprenavir  fosamprenavir This medicine may also interact with the following medications:  acitretin  aprepitant  armodafinil  bexarotene  bosentan  carbamazepine  certain medicines for fungal infections like fluconazole, ketoconazole, itraconazole and voriconazole  certain medicines to treat  hepatitis, HIV or AIDS  cyclosporine  felbamate  griseofulvin  lamotrigine  modafinil  oxcarbazepine  phenobarbital  phenytoin  primidone  rifabutin  rifampin  rifapentine  St. John's wort  topiramate This list may not describe all possible interactions. Give your health care provider a list of all the medicines, herbs, non-prescription drugs, or dietary supplements you use. Also tell them if you smoke, drink alcohol, or use illegal drugs. Some items may interact with your medicine. What should I watch for while using this medicine? This product does not protect you against HIV infection (AIDS) or other sexually transmitted diseases. You should be able to feel the implant by pressing your fingertips over the skin where it was inserted. Contact your doctor if you cannot feel the implant, and use a non-hormonal birth control method (such as condoms) until your doctor confirms that the implant is in place. Contact your doctor if you think that the implant may have broken or become bent while in your arm. You will receive a user card from your health care provider after the implant is inserted. The card is a record of the location of the implant in your upper arm and when it should be removed. Keep this card with your health records. What side effects may I notice from receiving this medicine? Side effects that you should report to your doctor or health care professional as soon as possible:  allergic reactions like skin rash, itching or hives, swelling of the face, lips, or tongue  breast lumps, breast tissue changes, or discharge  breathing problems  changes in emotions or moods  coughing up blood  if you feel that the implant   may have broken or bent while in your arm  high blood pressure  pain, irritation, swelling, or bruising at the insertion site  scar at site of insertion  signs of infection at the insertion site such as fever, and skin redness, pain or  discharge  signs and symptoms of a blood clot such as breathing problems; changes in vision; chest pain; severe, sudden headache; pain, swelling, warmth in the leg; trouble speaking; sudden numbness or weakness of the face, arm or leg  signs and symptoms of liver injury like dark yellow or brown urine; general ill feeling or flu-like symptoms; light-colored stools; loss of appetite; nausea; right upper belly pain; unusually weak or tired; yellowing of the eyes or skin  unusual vaginal bleeding, discharge Side effects that usually do not require medical attention (report to your doctor or health care professional if they continue or are bothersome):  acne  breast pain or tenderness  headache  irregular menstrual bleeding  nausea This list may not describe all possible side effects. Call your doctor for medical advice about side effects. You may report side effects to FDA at 1-800-FDA-1088. Where should I keep my medicine? This drug is given in a hospital or clinic and will not be stored at home. NOTE: This sheet is a summary. It may not cover all possible information. If you have questions about this medicine, talk to your doctor, pharmacist, or health care provider.  2020 Elsevier/Gold Standard (2019-04-17 11:33:04)  

## 2020-03-17 NOTE — Progress Notes (Signed)
GYN ENCOUNTER NOTE  Subjective:       Claudia Frederick is a 19 y.o. G0P0000 female is here for gynecologic evaluation of the following issues:  1. Continuous spotting since Nexplanon placement three (3) months ago in [place]  Denies difficulty breathing or respiratory distress, chest pain, abdominal pain, excessive vaginal bleeding, dysuria, and leg pain or swelling.    Gynecologic History  No LMP recorded. Patient has had an implant.  Contraception: Nexplanon   Last Pap: N/A  Obstetric History  OB History  Gravida Para Term Preterm AB Living  0 0 0 0 0 0  SAB TAB Ectopic Multiple Live Births  0 0 0 0 0    Past Medical History:  Diagnosis Date  . Migraines     Past Surgical History:  Procedure Laterality Date  . no surgical history      Current Outpatient Medications on File Prior to Visit  Medication Sig Dispense Refill  . etonogestrel (NEXPLANON) 68 MG IMPL implant 1 each by Subdermal route once.    . pantoprazole (PROTONIX) 20 MG tablet Take 2 tablets (40 mg total) by mouth daily. 30 tablet 1  . [DISCONTINUED] norelgestromin-ethinyl estradiol [employer]) 150-35 MCG/24HR transdermal patch Place 1 patch onto the skin once a week. 9 patch 4   No current facility-administered medications on file prior to visit.    No Known Allergies  Social History   Socioeconomic History  . Marital status: Single    Spouse name: Not on file  . Number of children: Not on file  . Years of education: Not on file  . Highest education level: Not on file  Occupational History  . Not on file  Tobacco Use  . Smoking status: Never Smoker  . Smokeless tobacco: Never Used  Vaping Use  . Vaping Use: Never used  Substance and Sexual Activity  . Alcohol use: Never  . Drug use: Never  . Sexual activity: Yes    Birth control/protection: Implant  Other Topics Concern  . Not on file  Social History Narrative  . Not on file   Social Determinants of Health   Financial  Resource Strain:   . Difficulty of Paying Living Expenses: Not on file  Food Insecurity:   . Worried About Programme researcher, broadcasting/film/video in the Last Year: Not on file  . Ran Out of Food in the Last Year: Not on file  Transportation Needs:   . Lack of Transportation (Medical): Not on file  . Lack of Transportation (Non-Medical): Not on file  Physical Activity:   . Days of Exercise per Week: Not on file  . Minutes of Exercise per Session: Not on file  Stress:   . Feeling of Stress : Not on file  Social Connections:   . Frequency of Communication with Friends and Family: Not on file  . Frequency of Social Gatherings with Friends and Family: Not on file  . Attends Religious Services: Not on file  . Active Member of Clubs or Organizations: Not on file  . Attends Banker Meetings: Not on file  . Marital Status: Not on file  Intimate Partner Violence:   . Fear of Current or Ex-Partner: Not on file  . Emotionally Abused: Not on file  . Physically Abused: Not on file  . Sexually Abused: Not on file    Family History  Problem Relation Age of Onset  . Migraines Mother   . Anxiety disorder Father   . Migraines Maternal Grandmother   .  Healthy Paternal Grandmother   . Healthy Paternal Grandfather     The following portions of the patient's history were reviewed and updated as appropriate: allergies, current medications, past family history, past medical history, past social history, past surgical history and problem list.  Review of Systems  ROS negative except as noted above. Information obtained from patient.   Objective:   BP 107/72   Pulse 79   Ht 5\' 3"  (1.6 m)   Wt 134 lb 8 oz (61 kg)   BMI 23.83 kg/m    CONSTITUTIONAL: Well-developed, well-nourished female in no acute distress.   PHYSICAL EXAM: Not indicated.   Assessment:   1. Breakthrough bleeding on Nexplanon  Plan:   Education regarding Nexplanon and its association with irregular vaginal bleeding.    Discussed other "higly effective" contraceptive options. Patient declines IUD.   Wishes to forgo removal at this time. Sample of Lo loestrin given.   Reviewed red flag symptoms and when to call.   RTC x 3 months for follow up or sooner if needed.    , CNM [facility], [facility] 03/17/20 2:28 PM

## 2020-09-15 ENCOUNTER — Other Ambulatory Visit: Payer: Self-pay

## 2020-09-15 ENCOUNTER — Ambulatory Visit (INDEPENDENT_AMBULATORY_CARE_PROVIDER_SITE_OTHER): Payer: Medicaid Other | Admitting: Certified Nurse Midwife

## 2020-09-15 ENCOUNTER — Encounter: Payer: Self-pay | Admitting: Certified Nurse Midwife

## 2020-09-15 VITALS — BP 108/65 | HR 81 | Ht 63.0 in | Wt 132.3 lb

## 2020-09-15 DIAGNOSIS — Z3169 Encounter for other general counseling and advice on procreation: Secondary | ICD-10-CM | POA: Diagnosis not present

## 2020-09-15 DIAGNOSIS — N940 Mittelschmerz: Secondary | ICD-10-CM

## 2020-09-15 NOTE — Progress Notes (Signed)
Pt present due to having abd bloating and bumps on stomach x 1 year. LMP 08/26/2020.

## 2020-09-15 NOTE — Progress Notes (Signed)
GYN ENCOUNTER NOTE  Subjective:       Claudia Frederick is a 20 y.o. G0P0000 female is here for gynecologic evaluation of the following issues:   1. Preconception counseling Reports having Nexplanon removed in December out of state, in [place].  Trying to conceive since Jan. Hasn't conceived yet and was worried.  2. Left and right sided lower abdominal pain, that takes place 3 weeks prior to menstrual period, sharp pain that immediately goes away. Patient reports not doing anything to relieve pain, goes away on its own once menstrual cycle takes place.   Denies difficulty breathing, respiratory distress, chest pain, excessive vaginal bleeding, and leg pain or swelling.  Gynecologic History  Patient's last menstrual period was 08/26/2020.  Period Duration (Days): 4-5 Period Pattern: (!) Irregular Menstrual Flow: Moderate Menstrual Control: Maxi pad Menstrual Control Change Freq (Hours): 2-6 Dysmenorrhea: (!) Mild Dysmenorrhea Symptoms: Cramping  Contraception: none   Last Pap: n/a .   Obstetric History  OB History  Gravida Para Term Preterm AB Living  0 0 0 0 0 0  SAB IAB Ectopic Multiple Live Births  0 0 0 0 0    Past Medical History:  Diagnosis Date  . Migraines     Past Surgical History:  Procedure Laterality Date  . no surgical history      Current Outpatient Medications on File Prior to Visit  Medication Sig Dispense Refill  . [DISCONTINUED] norelgestromin-ethinyl estradiol [facility]) 150-35 MCG/24HR transdermal patch Place 1 patch onto the skin once a week. 9 patch 4   No current facility-administered medications on file prior to visit.    No Known Allergies  Social History   Socioeconomic History  . Marital status: Single    Spouse name: Not on file  . Number of children: Not on file  . Years of education: Not on file  . Highest education level: Not on file  Occupational History  . Not on file  Tobacco Use  . Smoking status: Never Smoker  .  Smokeless tobacco: Never Used  Vaping Use  . Vaping Use: Never used  Substance and Sexual Activity  . Alcohol use: Never  . Drug use: Never  . Sexual activity: Yes    Birth control/protection: None  Other Topics Concern  . Not on file  Social History Narrative  . Not on file   Social Determinants of Health   Financial Resource Strain: Not on file  Food Insecurity: Not on file  Transportation Needs: Not on file  Physical Activity: Not on file  Stress: Not on file  Social Connections: Not on file  Intimate Partner Violence: Not on file    Family History  Problem Relation Age of Onset  . Migraines Mother   . Anxiety disorder Father   . Migraines Maternal Grandmother   . Healthy Paternal Grandmother   . Healthy Paternal Grandfather     The following portions of the patient's history were reviewed and updated as appropriate: allergies, current medications, past family history, past medical history, past social history, past surgical history and problem list.  Review of Systems  ROS- Negative other than what was reported above. information obtained verbally from patient.   Objective:   BP 108/65   Pulse 81   Ht 5\' 3"  (1.6 m)   Wt 60 kg   LMP 08/26/2020   BMI 23.44 kg/m    CONSTITUTIONAL: Well-developed, well-nourished female in no acute distress.   ABDOMEN: Soft, non distended; Non tender.  No Organomegaly.  Assessment:   Mittelschmerz  Encounter for Preconception Counseling  Plan:   Discussed tracking methods and timed intercourse; see AVS.   Encouraged taking a prenatal vitamin, samples provided.  Reassurance provided and questions answered  Reviewed red flag symptoms and when to call.  RTC if symptoms do not improve or worsen or sooner if needed.   Brayton Mars, SNM [facility] 09/15/20 11:49 AM

## 2020-09-15 NOTE — Patient Instructions (Signed)
Preparing for Pregnancy If you are planning to become pregnant, talk to your health care provider about preconception care. This type of care helps you prepare for a safe and healthy pregnancy. During this visit, your health care provider will:  Do a complete physical exam, including a Pap test.  Take your complete medical history.  Give you information, answer your questions, and help you resolve problems. Preconception checklist Medical history  Tell your health care provider about any medical conditions you have or have had. Your pregnancy or your ability to become pregnant may be affected by long-term (chronic) conditions, such as: ? Diabetes. ? High blood pressure (hypertension). ? Thyroid problems.  Tell your health care provider about your family's medical history and your partner's medical history.  Tell your health care provider if you have or have had any sexually transmitted infections, orSTIs. These can affect your pregnancy. In some cases, they can be passed to your baby.  If needed, discuss the benefits of genetic testing. This test checks for conditions that may be passed from parent to child.  Tell your health care provider about: ? Any problems you had getting pregnant or while pregnant. ? Any medicines you take. These include vitamins, herbal supplements, and over-the-counter medicines. ? Your history of getting vaccines. Discuss any vaccines that you may need. Diet  Ask your health care provider about what foods to eat in order to get a balance of nutrients. This is especially important when you are pregnant or preparing to become pregnant. It is recommended that women of childbearing age take a folic acid supplement of 400 mcg daily and eat foods rich in folic acid to prevent certain birth defects.  Ask your health care provider to help you reach a healthy weight before pregnancy. ? If you are overweight, you may have a higher risk for certain problems. These  include hypertension, diabetes, and early (preterm) birth. ? If you are underweight, you are more likely to have a baby who has a low birth weight. Lifestyle, work, and home Let your health care provider know about:  Any lifestyle habits that you have, such as use of alcohol, drugs, or tobacco products.  Fun and leisure activities that may put you at risk during pregnancy, such as downhill skiing and certain exercise programs.  Any plans to travel out of the country, especially to places with an active [place] virus outbreak.  Harmful substances that you may be exposed to at work or at home. These include chemicals, pesticides, radiation, and substances from cat litter boxes.  Any concerns you have for your safety at home. Mental health Tell your health care provider about:  Any history of mental health conditions, including feelings of depression, sadness, or anxiety.  Any medicines that you take for a mental health condition. These include herbs and supplements. How do I know that I am pregnant? You may be pregnant if you have been sexually active and you miss your period. Other symptoms of early pregnancy include:  Mild cramping.  Very light vaginal bleeding (spotting).  Feeling more tired than usual.  Nausea and vomiting. These may be signs of morning sickness. Take a home pregnancy test if you have any of these symptoms. This test checks for a hormone in your urine called human chorionic gonadotropin, or hCG. A woman's body begins to make this hormone during early pregnancy. These tests are very accurate. Wait until at least the first day after you miss your period to take a home pregnancy  test. If the test shows that you are pregnant, call your health care provider for a prenatal care visit. What should I do if I become pregnant?  Schedule a visit with your health care provider as soon as you suspect you are pregnant.  Talk to your health care provider if you are taking  prescription medicines to determine if they are safe to take during pregnancy.  You may continue to have sex if it does not cause pain or other problems, such as vaginal bleeding. Follow these instructions at home: Eating and drinking  Follow instructions from your health care provider about eating or drinking restrictions.  Drink enough fluid to keep your urine pale yellow.  Eat a balanced diet. This includes fresh fruits and vegetables, whole grains, lean meats, low-fat dairy products, healthy fats, and foods that are high in fiber. Ask to meet with a nutritionist or registered dietitian for help with meal planning and goals.  Avoid eating raw or undercooked meat and seafood.  Avoid eating or drinking unpasteurized dairy products.   Lifestyle  Get regular exercise. Try to be active for at least 30 minutes a day on most days of the week. Ask your health care provider which activities are safe during pregnancy.  Maintain a healthy weight.  Avoid toxic fumes and chemicals.  Avoid cleaning cat litter boxes. Cat feces may contain a harmful parasite called toxoplasma.  Avoid travel to countries where [place] virus is common.  Do not use any products that contain nicotine or tobacco, such as cigarettes, e-cigarettes, and chewing tobacco. If you need help quitting, ask your health care provider.  Do not drink alcohol or use drugs.      General instructions  Keep an accurate record of your menstrual periods. This makes it easier for your health care provider to determine your baby's due date.  Take over-the-counter and prescription medicines only as told by your health care provider.  Begin taking prenatal vitamins and folic acid supplements daily as directed.  Manage any chronic conditions, such as hypertension and diabetes, as told by your health care provider. This is important. Summary  If you are planning to become pregnant, talk to your health care provider about preconception  care. This is an important part of planning for a healthy pregnancy.  Women of childbearing age should take 400 mcg of folic acid daily in addition to eating a diet rich in folic acid. This will prevent certain birth defects.  Schedule a visit with your health care provider as soon as you suspect you are pregnant. Tell your health care provider about your medical history, lifestyle activities, home safety, and other things that may concern you. This information is not intended to replace advice given to you by your health care provider. Make sure you discuss any questions you have with your health care provider. Document Revised: 04/04/2019 Document Reviewed: 04/04/2019 Elsevier Patient Education  2021 Else[employer] Inc.   Mittelschmerz Mittelschmerz is pain in the belly (abdomen) that affects women. The pain happens between periods. The pain:  Affects one side of the belly. The side may change from month to month.  May be mild or very bad.  May last from minutes to hours. It does not last longer than 1-2 days.  Can happen with a feeling of being sick to your stomach (nausea).  Can happen with a small amount of bleeding from your vagina. Mittelschmerz is caused by the growth and release of an egg from an ovary. It is a  natural part of the ovulation cycle. It often happens about 2 weeks after a woman's period ends. Follow these instructions at home: Watch for any changes in your condition. Let your doctor know about them. Take these actions to help with your pain:  Try soaking in a hot bath.  Try a heating pad to relax the muscles in the belly.  Take over-the-counter and prescription medicines only as told by your doctor.  Keep all follow-up visits as told by your doctor. This is important.   Contact a doctor if:  The pain is very bad most months.  The pain lasts longer than 24 hours.  Your pain medicine is not helping.  You have a fever.  You feel sick to your stomach, and the  feeling does not go away.  You keep throwing up (vomiting).  You miss your period.  You have more than a small amount of blood coming from your vagina between periods. Summary  Mittelschmerz is a condition that affects women. It is pain in the belly that happens between periods.  This condition is caused by the growth and release of an egg from an ovary.  The pain may affect one side of the belly, may be mild or very bad, or may cause you to feel sick to your stomach.  To help with your pain, soak in a hot bath, use a heating pad, or take medicines.  Watch for any changes in your condition. Know when to contact a doctor for help. This information is not intended to replace advice given to you by your health care provider. Make sure you discuss any questions you have with your health care provider. Document Revised: 01/17/2018 Document Reviewed: 01/17/2018 Elsevier Patient Education  2021 [employer].

## 2020-09-15 NOTE — Progress Notes (Signed)
I have seen, interviewed, and examined the patient in conjunction with the [facility] [employer] [facility] and affirm the diagnosis and management plan.   Gunnar Bulla, CNM [facility] 09/15/20 11:49 AM

## 2020-09-24 ENCOUNTER — Telehealth: Payer: Self-pay | Admitting: Certified Nurse Midwife

## 2020-09-24 MED ORDER — VITAFOL GUMMIES 3.33-0.333-34.8 MG PO CHEW: 3.3300 mg | CHEWABLE_TABLET | Freq: Every day | ORAL | 3 refills | Status: DC

## 2020-09-24 NOTE — Telephone Encounter (Signed)
Pt called requesting a rx for prenatal gummies that were given at her last visit- she did notknow the name of prenatals - pt confirmed her pharmacy as : [facility]

## 2020-09-24 NOTE — Telephone Encounter (Signed)
Vitafol gummie erx. Pt aware.

## 2020-10-17 ENCOUNTER — Ambulatory Visit
Admission: EM | Admit: 2020-10-17 | Discharge: 2020-10-17 | Disposition: A | Discharge status: Home / Self Care | Payer: Medicaid Other | Attending: Physician Assistant | Admitting: Physician Assistant

## 2020-10-17 ENCOUNTER — Other Ambulatory Visit: Payer: Self-pay

## 2020-10-17 DIAGNOSIS — D649 Anemia, unspecified: Secondary | ICD-10-CM | POA: Diagnosis not present

## 2020-10-17 DIAGNOSIS — K5900 Constipation, unspecified: Secondary | ICD-10-CM | POA: Insufficient documentation

## 2020-10-17 DIAGNOSIS — R110 Nausea: Secondary | ICD-10-CM

## 2020-10-17 DIAGNOSIS — R42 Dizziness and giddiness: Secondary | ICD-10-CM | POA: Diagnosis not present

## 2020-10-17 LAB — BASIC METABOLIC PANEL
Anion gap: 5 (ref 5–15)
BUN: 10 mg/dL (ref 6–20)
CO2: 27 mmol/L (ref 22–32)
Calcium: 8.9 mg/dL (ref 8.9–10.3)
Chloride: 104 mmol/L (ref 98–111)
Creatinine, Ser: 0.49 mg/dL (ref 0.44–1.00)
GFR, Estimated: >60 mL/min (ref >60)
Glucose, Bld: 90 mg/dL (ref 70–99)
Potassium: 4 mmol/L (ref 3.5–5.1)
Sodium: 136 mmol/L (ref 135–145)

## 2020-10-17 LAB — CBC WITH DIFFERENTIAL/PLATELET
Abs Immature Granulocytes: 0 10*3/uL (ref 0.00–0.07)
Basophils Absolute: 0 10*3/uL (ref 0.0–0.1)
Basophils Relative: 1 %
Eosinophils Absolute: 0.1 10*3/uL (ref 0.0–0.5)
Eosinophils Relative: 3 %
HCT: 32.9 % — ABNORMAL LOW (ref 36.0–46.0)
Hemoglobin: 10.6 g/dL — ABNORMAL LOW (ref 12.0–15.0)
Immature Granulocytes: 0 %
Lymphocytes Relative: 31 %
Lymphs Abs: 1.8 10*3/uL (ref 0.7–4.0)
MCH: 27.4 pg (ref 26.0–34.0)
MCHC: 32.2 g/dL (ref 30.0–36.0)
MCV: 85 fL (ref 80.0–100.0)
Monocytes Absolute: 0.5 10*3/uL (ref 0.1–1.0)
Monocytes Relative: 9 %
Neutro Abs: 3.2 10*3/uL (ref 1.7–7.7)
Neutrophils Relative %: 56 %
Platelets: 224 10*3/uL (ref 150–400)
RBC: 3.87 MIL/uL (ref 3.87–5.11)
RDW: 14.7 % (ref 11.5–15.5)
WBC: 5.7 10*3/uL (ref 4.0–10.5)
nRBC: 0 % (ref 0.0–0.2)

## 2020-10-17 LAB — URINALYSIS, COMPLETE (UACMP) WITH MICROSCOPIC
Bacteria, UA: NONE SEEN
Bilirubin Urine: NEGATIVE
Glucose, UA: NEGATIVE mg/dL
Hgb urine dipstick: NEGATIVE
Ketones, ur: NEGATIVE mg/dL
Leukocytes,Ua: NEGATIVE
Nitrite: NEGATIVE
Protein, ur: NEGATIVE mg/dL
Specific Gravity, Urine: >1.03 — ABNORMAL HIGH (ref 1.005–1.030)
pH: 7 (ref 5.0–8.0)

## 2020-10-17 LAB — PREGNANCY, URINE: Preg Test, Ur: NEGATIVE

## 2020-10-17 MED ORDER — DOCUSATE SODIUM 50 MG/5ML PO LIQD
100.0000 mg | Freq: Two times a day (BID) | ORAL | 0 refills | Status: AC | PRN
Start: 2020-10-17 — End: 2020-10-24

## 2020-10-17 NOTE — ED Triage Notes (Signed)
Pt reports having several episodes of emesis and feeling dizzy within the last week.  sts she is not feeling dizzy at this time. It is mostly when she change positions too fast.

## 2020-10-17 NOTE — Discharge Instructions (Addendum)
The EKG is normal.  The urine shows that you are on the dehydrated side.  This is likely the reason for your dizziness.  Your pregnancy test is negative.  You should increase your rest and fluids and be slow to change positions.  Additionally your hemoglobin is low which indicates anemia.  Please follow-up with your PCP about this.  It is important to investigate the type of anemia after you receive the right treatment.  If you do not go to the same PCP then you need to contact Medicaid and have them assign you a new PCP.  For now increase your iron fortified foods.  I have sent a liquid for your constipation.  You need to increase your fluid intake a lot though.  If this does not produce a bowel movement in a couple of days then start MiraLAX which you can purchase over-the-counter.  Go to ED for any severe abdominal pain or if you start to have a lot of vomiting or have not had a bowel movement in the next week.

## 2020-10-17 NOTE — ED Provider Notes (Signed)
[facility]    CSN: [id number] Arrival date & time: 10/17/20  7341      History   Chief Complaint Chief Complaint  Patient presents with  . Emesis    HPI Claudia Frederick is a 20 y.o. female presenting for 1 week history of intermittent dizziness with associated nausea.  Patient says that she feels like she is going to vomit but has not had any actual vomiting.  She says that the dizziness and nausea seem to occur when she stands up.  It improves when she sits down.  Says that the episodes happen usually at least once a day and the last episode was yesterday.  She says it last for couple of minutes and then resolves.  She denies any associated headaches, vision changes or blurred vision, numbness/tingling or weakness, chest pain or breathing difficulty.  Patient says that she does get occasional palpitations when she feels anxious.  She says that she feels well now and is not having any current symptoms of dizziness.  Denies any recent illness, no fever, fatigue, cough, congestion or sore throat.  Patient is concerned about possible pregnancy.  Last menstrual period was on 09/25/2020.  She says that she does not use protection and is trying to get pregnant.  She also states that she has not been having normal bowel movements for the past couple of weeks.  She says that she only has small amounts of stool that she produces.  Last bowel movement was a couple of days ago.  Has not tried any over-the-counter stool softeners or laxatives.  Past medical history significant for migraines and anemia.  She has no other concerns.  HPI  Past Medical History:  Diagnosis Date  . Migraines     There are no problems to display for this patient.   Past Surgical History:  Procedure Laterality Date  . no surgical history      OB History    Gravida  0   Para  0   Term  0   Preterm  0   AB  0   Living  0     SAB  0   IAB  0   Ectopic  0   Multiple  0   Live  Births  0            Home Medications    Prior to Admission medications   Medication Sig Start Date End Date Taking? Authorizing Provider  docusate (COLACE) 50 MG/5ML liquid Take 10 mLs (100 mg total) by mouth 2 (two) times daily as needed for up to 7 days for mild constipation. 10/17/20 10/24/20 Yes Shirlee Latch, PA-C  Prenatal Vit-Fe Phos-FA-Omega (VITAFOL GUMMIES) 3.33-0.333-34.8 MG CHEW Chew 3.33 mg by mouth daily. 09/24/20   Gunnar Bulla, CNM  norelgestromin-ethinyl estradiol [employer]) 150-35 MCG/24HR transdermal patch Place 1 patch onto the skin once a week. 10/08/19 02/25/20  Gunnar Bulla, CNM    Family History Family History  Problem Relation Age of Onset  . Migraines Mother   . Anxiety disorder Father   . Migraines Maternal Grandmother   . Healthy Paternal Grandmother   . Healthy Paternal Grandfather     Social History Social History   Tobacco Use  . Smoking status: Never Smoker  . Smokeless tobacco: Never Used  Vaping Use  . Vaping Use: Never used  Substance Use Topics  . Alcohol use: Never  . Drug use: Never     Allergies  Patient has no known allergies.   Review of Systems Review of Systems  Constitutional: Negative for chills, diaphoresis, fatigue and fever.  HENT: Negative for congestion, ear pain, rhinorrhea, sinus pressure, sinus pain and sore throat.   Respiratory: Negative for cough and shortness of breath.   Gastrointestinal: Positive for constipation and nausea. Negative for abdominal distention, abdominal pain, diarrhea and vomiting.  Musculoskeletal: Negative for arthralgias and myalgias.  Skin: Negative for rash.  Neurological: Positive for dizziness. Negative for weakness and headaches.  Hematological: Negative for adenopathy.     Physical Exam Triage Vital Signs ED Triage Vitals  Enc Vitals Group     BP 10/17/20 0937 116/77     Pulse Rate 10/17/20 0937 84     Resp 10/17/20 0937 18     Temp 10/17/20 0937 98.2  F (36.8 C)     Temp Source 10/17/20 0937 Oral     SpO2 10/17/20 0937 100 %     Weight --      Height 10/17/20 0938 5\' 3"  (1.6 m)     Head Circumference --      Peak Flow --      Pain Score 10/17/20 0938 0     Pain Loc --      Pain Edu? --      Excl. in GC? --    Orthostatic VS for the past 24 hrs:  BP- Lying Pulse- Lying BP- Sitting Pulse- Sitting BP- Standing at 0 minutes Pulse- Standing at 0 minutes  10/17/20 1002 111/71 70 105/72 77 111/73 81    Updated Vital Signs BP 116/77   Pulse 84   Temp 98.2 F (36.8 C) (Oral)   Resp 18   Ht 5\' 3"  (1.6 m)   LMP 09/25/2020   SpO2 100%   BMI 23.44 kg/m       Physical Exam Vitals and nursing note reviewed.  Constitutional:      General: She is not in acute distress.    Appearance: Normal appearance. She is not ill-appearing or toxic-appearing.  HENT:     Head: Normocephalic and atraumatic.     Right Ear: Tympanic membrane, ear canal and external ear normal.     Left Ear: Tympanic membrane, ear canal and external ear normal.     Nose: Nose normal.     Mouth/Throat:     Mouth: Mucous membranes are moist.     Pharynx: Oropharynx is clear. No posterior oropharyngeal erythema.  Eyes:     General: No scleral icterus.       Right eye: No discharge.        Left eye: No discharge.     Conjunctiva/sclera: Conjunctivae normal.  Cardiovascular:     Rate and Rhythm: Normal rate and regular rhythm.     Heart sounds: Normal heart sounds.  Pulmonary:     Effort: Pulmonary effort is normal. No respiratory distress.     Breath sounds: Normal breath sounds.  Abdominal:     Palpations: Abdomen is soft.     Tenderness: There is no abdominal tenderness.  Musculoskeletal:     Cervical back: Neck supple.  Lymphadenopathy:     Cervical: No cervical adenopathy.  Skin:    General: Skin is dry.  Neurological:     General: No focal deficit present.     Mental Status: She is alert and oriented to person, place, and time. Mental status is  at baseline.     Cranial Nerves: No cranial nerve deficit.  Motor: No weakness.     Gait: Gait normal.  Psychiatric:        Mood and Affect: Mood normal.        Behavior: Behavior normal.        Thought Content: Thought content normal.      UC Treatments / Results  Labs (all labs ordered are listed, but only abnormal results are displayed) Labs Reviewed  URINALYSIS, COMPLETE (UACMP) WITH MICROSCOPIC - Abnormal; Notable for the following components:      Result Value   APPearance HAZY (*)    Specific Gravity, Urine >1.030 (*)    All other components within normal limits  CBC WITH DIFFERENTIAL/PLATELET - Abnormal; Notable for the following components:   Hemoglobin 10.6 (*)    HCT 32.9 (*)    All other components within normal limits  PREGNANCY, URINE  BASIC METABOLIC PANEL    EKG   Radiology No results found.  Procedures ED EKG  Date/Time: 10/17/2020 10:34 AM Performed by: Shirlee LatchEaves, Fabrizio Filip B, PA-C Authorized by: Shirlee LatchEaves, Ramzi Brathwaite B, PA-C   ECG reviewed by ED Physician in the absence of a cardiologist: yes   Previous ECG:    Previous ECG:  Unavailable Interpretation:    Interpretation: normal   Rate:    ECG rate:  66   ECG rate assessment: normal   Rhythm:    Rhythm: sinus rhythm   Ectopy:    Ectopy: none   QRS:    QRS axis:  Right   QRS intervals:  Normal   QRS conduction: normal   ST segments:    ST segments:  Normal T waves:    T waves: normal   Comments:     Normal sinus rhythm with sinus arrhythmia.   (including critical care time)  Medications Ordered in UC Medications - No data to display  Initial Impression / Assessment and Plan / UC Course  I have reviewed the triage vital signs and the nursing notes.  Pertinent labs & imaging results that were available during my care of the patient were reviewed by me and considered in my medical decision making (see chart for details).   20 year old female presenting for intermittent dizziness and nausea  episodes.  Last episode was yesterday.  Patient currently feeling well.  All vital signs are normal and stable.  Patient is not orthostatic.  Exam is benign and completely within normal limits.  EKG performed today shows normal sinus rhythm with sinus arrhythmia.  Regular rate.  Urinalysis today shows greater than 1.030 specific gravity but no sign of UTI.   Pregnancy test is negative.  Due to patient's history of anemia obtained.  Also obtaining BMP to assess electrolyte status which, if abnormal, could cause some of the symptoms.  Hemoglobin is 10.6.  MCV is 85.  Patient does have history of anemia, but says she does not know what type and is never taken any medication for anemia.  Advised patient her symptoms likely due to some mild dehydration and to increase her fluid intake and rest.  Advised her that her anemia may also be playing a role in her symptoms and she needs to follow-up with PCP as soon as possible for work-up of the anemia and proper treatment.  Advised her at this time to increase iron fortified foods and increase fluid intake as well.  I did prescribe Colace for constipation and advised her to take over-the-counter MiraLAX or use a suppository if she has not a bowel movement in the next couple  days.  ED red flag signs and symptoms reviewed for dizziness and nausea.  ED precautions also reviewed for constipation.  Final Clinical Impressions(s) / UC Diagnoses   Final diagnoses:  Dizziness  Nausea without vomiting  Anemia, unspecified type  Constipation, unspecified constipation type     Discharge Instructions     The EKG is normal.  The urine shows that you are on the dehydrated side.  This is likely the reason for your dizziness.  Your pregnancy test is negative.  You should increase your rest and fluids and be slow to change positions.  Additionally your hemoglobin is low which indicates anemia.  Please follow-up with your PCP about this.  It is important to  investigate the type of anemia after you receive the right treatment.  If you do not go to the same PCP then you need to contact Medicaid and have them assign you a new PCP.  For now increase your iron fortified foods.  I have sent a liquid for your constipation.  You need to increase your fluid intake a lot though.  If this does not produce a bowel movement in a couple of days then start MiraLAX which you can purchase over-the-counter.  Go to ED for any severe abdominal pain or if you start to have a lot of vomiting or have not had a bowel movement in the next week.    ED Prescriptions    Medication Sig Dispense Auth. Provider   docusate (COLACE) 50 MG/5ML liquid Take 10 mLs (100 mg total) by mouth 2 (two) times daily as needed for up to 7 days for mild constipation. 100 mL Shirlee Latch, PA-C     PDMP not reviewed this encounter.   Shirlee Latch, PA-C 10/17/20 1122

## 2020-11-10 ENCOUNTER — Encounter: Payer: Self-pay | Admitting: Emergency Medicine

## 2020-11-10 ENCOUNTER — Ambulatory Visit
Admission: EM | Admit: 2020-11-10 | Discharge: 2020-11-10 | Disposition: A | Discharge status: Home / Self Care | Payer: Medicaid Other | Attending: Physician Assistant | Admitting: Physician Assistant

## 2020-11-10 ENCOUNTER — Other Ambulatory Visit: Payer: Self-pay

## 2020-11-10 DIAGNOSIS — N3000 Acute cystitis without hematuria: Secondary | ICD-10-CM | POA: Diagnosis not present

## 2020-11-10 DIAGNOSIS — R1030 Lower abdominal pain, unspecified: Secondary | ICD-10-CM | POA: Diagnosis not present

## 2020-11-10 LAB — URINALYSIS, COMPLETE (UACMP) WITH MICROSCOPIC
Bilirubin Urine: NEGATIVE
Glucose, UA: NEGATIVE mg/dL
Hgb urine dipstick: NEGATIVE
Ketones, ur: NEGATIVE mg/dL
Nitrite: NEGATIVE
Protein, ur: NEGATIVE mg/dL
Specific Gravity, Urine: 1.025 (ref 1.005–1.030)
pH: 6 (ref 5.0–8.0)

## 2020-11-10 LAB — PREGNANCY, URINE: Preg Test, Ur: NEGATIVE

## 2020-11-10 MED ORDER — CEPHALEXIN 250 MG/5ML PO SUSR: 500.0000 mg | Freq: Two times a day (BID) | ORAL | 0 refills | Status: AC

## 2020-11-10 MED ORDER — NITROFURANTOIN MONOHYD MACRO 100 MG PO CAPS: 100.0000 mg | ORAL_CAPSULE | Freq: Two times a day (BID) | ORAL | 0 refills | Status: AC

## 2020-11-10 NOTE — ED Triage Notes (Signed)
Pt c/o LLQ abdominal pain. Started about 3 days ago. She was at [facility] ED last night and left without being seen. Denies vomiting or dizziness. Denies vaginal discharge.

## 2020-11-10 NOTE — ED Provider Notes (Signed)
[facility]    CSN: [id number] Arrival date & time: 11/10/20  1154      History   Chief Complaint Chief Complaint  Patient presents with  . Abdominal Pain    HPI Claudia Frederick is a 20 y.o. female presenting for left lower quadrant pain x3 days.  She says the pain comes and goes and is aching.  She denies any associated symptoms.  She has not had any fevers, fatigue, nausea, vomiting or diarrhea.  No constipation.  No abdominal vaginal discharge/itching or odor.  No dysuria, urinary frequency or urgency.  No flank pain or back pain.  Patient says that she thinks about taking something for the pain and then when she goes to take something she does not have the pain anymore.  So, she has not taken anything for pain relief.  Patient to go to the emergency department last night but left due to wait time.  She has no other concerns today.  HPI  Past Medical History:  Diagnosis Date  . Migraines     There are no problems to display for this patient.   Past Surgical History:  Procedure Laterality Date  . no surgical history      OB History    Gravida  0   Para  0   Term  0   Preterm  0   AB  0   Living  0     SAB  0   IAB  0   Ectopic  0   Multiple  0   Live Births  0            Home Medications    Prior to Admission medications   Medication Sig Start Date End Date Taking? Authorizing Provider  cephALEXin (KEFLEX) 250 MG/5ML suspension Take 10 mLs (500 mg total) by mouth 2 (two) times daily for 7 days. 11/10/20 11/17/20 Yes Shirlee Latch, PA-C  nitrofurantoin, macrocrystal-monohydrate, (MACROBID) 100 MG capsule Take 1 capsule (100 mg total) by mouth 2 (two) times daily for 5 days. 11/10/20 11/15/20 Yes Shirlee Latch, PA-C  Prenatal Vit-Fe Phos-FA-Omega (VITAFOL GUMMIES) 3.33-0.333-34.8 MG CHEW Chew 3.33 mg by mouth daily. 09/24/20  Yes Lawhorn, Vanessa [place], CNM  norelgestromin-ethinyl estradiol [employer]) 150-35 MCG/24HR  transdermal patch Place 1 patch onto the skin once a week. 10/08/19 02/25/20  Gunnar Bulla, CNM    Family History Family History  Problem Relation Age of Onset  . Migraines Mother   . Anxiety disorder Father   . Migraines Maternal Grandmother   . Healthy Paternal Grandmother   . Healthy Paternal Grandfather     Social History Social History   Tobacco Use  . Smoking status: Never Smoker  . Smokeless tobacco: Never Used  Vaping Use  . Vaping Use: Never used  Substance Use Topics  . Alcohol use: Never  . Drug use: Never     Allergies   Patient has no known allergies.   Review of Systems Review of Systems  Constitutional: Negative for chills, fatigue and fever.  Respiratory: Negative for shortness of breath.   Cardiovascular: Negative for chest pain.  Gastrointestinal: Positive for abdominal pain. Negative for diarrhea, nausea and vomiting.  Genitourinary: Negative for decreased urine volume, dysuria, flank pain, frequency, hematuria, pelvic pain, urgency, vaginal bleeding, vaginal discharge and vaginal pain.  Musculoskeletal: Negative for back pain.  Skin: Negative for rash.     Physical Exam Triage Vital Signs ED Triage Vitals  Enc Vitals Group  BP 11/10/20 1225 114/76     Pulse Rate 11/10/20 1225 81     Resp 11/10/20 1225 18     Temp 11/10/20 1225 98.2 F (36.8 C)     Temp Source 11/10/20 1225 Oral     SpO2 11/10/20 1225 100 %     Weight 11/10/20 1223 132 lb 4.4 oz (60 kg)     Height 11/10/20 1223 5\' 3"  (1.6 m)     Head Circumference --      Peak Flow --      Pain Score 11/10/20 1223 8     Pain Loc --      Pain Edu? --      Excl. in GC? --    No data found.  Updated Vital Signs BP 114/76 (BP Location: Right Arm)   Pulse 81   Temp 98.2 F (36.8 C) (Oral)   Resp 18   Ht 5\' 3"  (1.6 m)   Wt 132 lb 4.4 oz (60 kg)   LMP 10/30/2020 (Approximate)   SpO2 100%   BMI 23.43 kg/m      Physical Exam Vitals and nursing note reviewed.   Constitutional:      General: She is not in acute distress.    Appearance: Normal appearance. She is not ill-appearing or toxic-appearing.  HENT:     Head: Normocephalic and atraumatic.  Eyes:     General: No scleral icterus.       Right eye: No discharge.        Left eye: No discharge.     Conjunctiva/sclera: Conjunctivae normal.  Cardiovascular:     Rate and Rhythm: Normal rate and regular rhythm.     Heart sounds: Normal heart sounds.  Pulmonary:     Effort: Pulmonary effort is normal. No respiratory distress.     Breath sounds: Normal breath sounds.  Abdominal:     General: Bowel sounds are normal.     Tenderness: There is abdominal tenderness in the right lower quadrant, suprapubic area and left lower quadrant.  Musculoskeletal:     Cervical back: Neck supple.  Skin:    General: Skin is dry.  Neurological:     General: No focal deficit present.     Mental Status: She is alert. Mental status is at baseline.     Motor: No weakness.     Gait: Gait normal.  Psychiatric:        Mood and Affect: Mood normal.        Behavior: Behavior normal.        Thought Content: Thought content normal.      UC Treatments / Results  Labs (all labs ordered are listed, but only abnormal results are displayed) Labs Reviewed  URINALYSIS, COMPLETE (UACMP) WITH MICROSCOPIC - Abnormal; Notable for the following components:      Result Value   APPearance HAZY (*)    Leukocytes,Ua TRACE (*)    Bacteria, UA FEW (*)    All other components within normal limits  URINE CULTURE  PREGNANCY, URINE    EKG   Radiology No results found.  Procedures Procedures (including critical care time)  Medications Ordered in UC Medications - No data to display  Initial Impression / Assessment and Plan / UC Course  I have reviewed the triage vital signs and the nursing notes.  Pertinent labs & imaging results that were available during my care of the patient were reviewed by me and considered in  my medical decision making (see chart for  details).  19 year old female presenting for lower abdominal pain x3 days.  Denies any associated symptoms.  On exam she does have some left lower quadrant tenderness but she also has some mild right lower quadrant tenderness and suprapubic tenderness.  UA today shows trace leukocytes and bacteria seen under microscope.  We will send urine for culture and treat for suspected UTI at this time with Macrobid.  Advise increase rest and fluids.   CBC, CMP, pregnancy test and urinalysis performed at [facility] ED in [place].  I reviewed all these labs.  Negative pregnancy and CBC and CMP unrevealing.  Patient already has underlying/known anemia.  She does take a multivitamin for this periodically.  Advised patient to keep an eye on her symptoms and follow-up for any worsening symptoms.  Reviewed ED red flag signs and symptoms with patient.  Encouraged her to find a PCP.  Final Clinical Impressions(s) / UC Diagnoses   Final diagnoses:  Lower abdominal pain  Acute cystitis without hematuria     Discharge Instructions     I was able to review your labs from last night and they all look good.  Last night and also today you have a lot of white blood cells in the urine and some bacteria seen under the microscope.  This is consistent with a suspected UTI.  I will culture the urine but since you are having a lot of pain in the lower abdominal area, I have sent an antibiotic for you.  Increase rest and fluids.  You can take Tylenol and/or ibuprofen as needed for the pain.  If you develop a fever or any worsening abdominal pain he should go back to the emergency department.  Please contact Medicaid as they will assign you a PCP.  It is important to have a PCP.    ED Prescriptions    Medication Sig Dispense Auth. Provider   nitrofurantoin, macrocrystal-monohydrate, (MACROBID) 100 MG capsule Take 1 capsule (100 mg total) by mouth 2 (two) times daily for 5 days. 10  capsule Eusebio Friendly B, PA-C   cephALEXin (KEFLEX) 250 MG/5ML suspension Take 10 mLs (500 mg total) by mouth 2 (two) times daily for 7 days. 140 mL Shirlee Latch, PA-C     PDMP not reviewed this encounter.   Shirlee Latch, PA-C 11/10/20 1430

## 2020-11-10 NOTE — Discharge Instructions (Addendum)
I was able to review your labs from last night and they all look good.  Last night and also today you have a lot of white blood cells in the urine and some bacteria seen under the microscope.  This is consistent with a suspected UTI.  I will culture the urine but since you are having a lot of pain in the lower abdominal area, I have sent an antibiotic for you.  Increase rest and fluids.  You can take Tylenol and/or ibuprofen as needed for the pain.  If you develop a fever or any worsening abdominal pain he should go back to the emergency department.  Please contact Medicaid as they will assign you a PCP.  It is important to have a PCP.

## 2020-11-12 LAB — URINE CULTURE: Culture: <10000 — AB

## 2020-11-13 ENCOUNTER — Other Ambulatory Visit: Payer: Self-pay

## 2020-11-13 ENCOUNTER — Emergency Department
Admission: EM | Admit: 2020-11-13 | Discharge: 2020-11-14 | Disposition: A | Discharge status: Home / Self Care | Payer: Medicaid Other | Attending: Emergency Medicine | Admitting: Emergency Medicine

## 2020-11-13 ENCOUNTER — Encounter: Payer: Self-pay | Admitting: Emergency Medicine

## 2020-11-13 DIAGNOSIS — N83202 Unspecified ovarian cyst, left side: Secondary | ICD-10-CM | POA: Diagnosis not present

## 2020-11-13 DIAGNOSIS — R1032 Left lower quadrant pain: Secondary | ICD-10-CM

## 2020-11-13 LAB — URINALYSIS, COMPLETE (UACMP) WITH MICROSCOPIC
Bacteria, UA: NONE SEEN
Bilirubin Urine: NEGATIVE
Glucose, UA: NEGATIVE mg/dL
Hgb urine dipstick: NEGATIVE
Ketones, ur: NEGATIVE mg/dL
Nitrite: NEGATIVE
Protein, ur: NEGATIVE mg/dL
Specific Gravity, Urine: 1.024 (ref 1.005–1.030)
pH: 6 (ref 5.0–8.0)

## 2020-11-13 LAB — CBC
HCT: 31.7 % — ABNORMAL LOW (ref 36.0–46.0)
Hemoglobin: 10 g/dL — ABNORMAL LOW (ref 12.0–15.0)
MCH: 27 pg (ref 26.0–34.0)
MCHC: 31.5 g/dL (ref 30.0–36.0)
MCV: 85.4 fL (ref 80.0–100.0)
Platelets: 252 10*3/uL (ref 150–400)
RBC: 3.71 MIL/uL — ABNORMAL LOW (ref 3.87–5.11)
RDW: 15 % (ref 11.5–15.5)
WBC: 9.4 10*3/uL (ref 4.0–10.5)
nRBC: 0 % (ref 0.0–0.2)

## 2020-11-13 LAB — COMPREHENSIVE METABOLIC PANEL
ALT: 13 U/L (ref 0–44)
AST: 16 U/L (ref 15–41)
Albumin: 4 g/dL (ref 3.5–5.0)
Alkaline Phosphatase: 57 U/L (ref 38–126)
Anion gap: 6 (ref 5–15)
BUN: 14 mg/dL (ref 6–20)
CO2: 25 mmol/L (ref 22–32)
Calcium: 8.9 mg/dL (ref 8.9–10.3)
Chloride: 106 mmol/L (ref 98–111)
Creatinine, Ser: 0.57 mg/dL (ref 0.44–1.00)
GFR, Estimated: >60 mL/min (ref >60)
Glucose, Bld: 88 mg/dL (ref 70–99)
Potassium: 3.5 mmol/L (ref 3.5–5.1)
Sodium: 137 mmol/L (ref 135–145)
Total Bilirubin: 0.4 mg/dL (ref 0.3–1.2)
Total Protein: 7.7 g/dL (ref 6.5–8.1)

## 2020-11-13 LAB — POC URINE PREG, ED: Preg Test, Ur: NEGATIVE

## 2020-11-13 LAB — LIPASE, BLOOD: Lipase: 33 U/L (ref 11–51)

## 2020-11-13 MED ORDER — IBUPROFEN 100 MG/5ML PO SUSP
600.0000 mg | Freq: Once | ORAL | Status: AC
  Administered 2020-11-14: 600 mg via ORAL
  Filled 2020-11-13 (×3): qty 30

## 2020-11-13 NOTE — ED Triage Notes (Signed)
Pt in via POV, complaints of LLQ abdominal pain x approximately 1 week.  Denies any abnormal vaginal discharge or bleeding.  Denies any N/V/D.  Ambulatory to triage, vitals WDL, NAD noted at this time.

## 2020-11-14 ENCOUNTER — Emergency Department: Payer: Medicaid Other

## 2020-11-14 LAB — WET PREP, GENITAL
Clue Cells Wet Prep HPF POC: NONE SEEN
Sperm: NONE SEEN
Trich, Wet Prep: NONE SEEN
Yeast Wet Prep HPF POC: NONE SEEN

## 2020-11-14 LAB — CHLAMYDIA/NGC RT PCR (ARMC ONLY)
Chlamydia Tr: NOT DETECTED
N gonorrhoeae: NOT DETECTED

## 2020-11-14 LAB — HCG, QUANTITATIVE, PREGNANCY: hCG, Beta Chain, Quant, S: <1 m[IU]/mL (ref <5)

## 2020-11-14 NOTE — ED Notes (Signed)
Pharmacy to send advil suspension

## 2020-11-14 NOTE — ED Provider Notes (Signed)
[facility] Emergency Department Provider Note  ____________________________________________  Time seen: Approximately 1:25 AM  I have reviewed the triage vital signs and the nursing notes.   HISTORY  Chief Complaint Abdominal Pain   HPI Claudia Frederick is a 20 y.o. female with history of migraines who presents for evaluation of abdominal pain.  Patient reports a sharp and aching constant left lower quadrant abdominal pain for a week.  Has had no nausea, vomiting, diarrhea, constipation, dysuria, hematuria, vaginal discharge, fever or chills.  Denies any prior history of ovarian pathology.  The pain is moderate in intensity.  Has not tried anything at home for the pain.   Past Medical History:  Diagnosis Date  . Migraines     There are no problems to display for this patient.   Past Surgical History:  Procedure Laterality Date  . no surgical history      Prior to Admission medications   Medication Sig Start Date End Date Taking? Authorizing Provider  cephALEXin (KEFLEX) 250 MG/5ML suspension Take 10 mLs (500 mg total) by mouth 2 (two) times daily for 7 days. 11/10/20 11/17/20  Shirlee Latch, PA-C  nitrofurantoin, macrocrystal-monohydrate, (MACROBID) 100 MG capsule Take 1 capsule (100 mg total) by mouth 2 (two) times daily for 5 days. 11/10/20 11/15/20  Shirlee Latch, PA-C  Prenatal Vit-Fe Phos-FA-Omega (VITAFOL GUMMIES) 3.33-0.333-34.8 MG CHEW Chew 3.33 mg by mouth daily. 09/24/20   Gunnar Bulla, CNM  norelgestromin-ethinyl estradiol [employer]) 150-35 MCG/24HR transdermal patch Place 1 patch onto the skin once a week. 10/08/19 02/25/20  Gunnar Bulla, CNM    Allergies Patient has no known allergies.  Family History  Problem Relation Age of Onset  . Migraines Mother   . Anxiety disorder Father   . Migraines Maternal Grandmother   . Healthy Paternal Grandmother   . Healthy Paternal Grandfather     Social  History Social History   Tobacco Use  . Smoking status: Never Smoker  . Smokeless tobacco: Never Used  Vaping Use  . Vaping Use: Never used  Substance Use Topics  . Alcohol use: Never  . Drug use: Never    Review of Systems  Constitutional: Negative for fever. Eyes: Negative for visual changes. ENT: Negative for sore throat. Neck: No neck pain  Cardiovascular: Negative for chest pain. Respiratory: Negative for shortness of breath. Gastrointestinal: + LLQ abdominal pain. No vomiting or diarrhea. Genitourinary: Negative for dysuria. Musculoskeletal: Negative for back pain. Skin: Negative for rash. Neurological: Negative for headaches, weakness or numbness. Psych: No SI or HI  ____________________________________________   PHYSICAL EXAM:  VITAL SIGNS: ED Triage Vitals  Enc Vitals Group     BP 11/13/20 2045 119/79     Pulse Rate 11/13/20 2045 69     Resp 11/13/20 2045 16     Temp 11/13/20 2045 98.4 F (36.9 C)     Temp Source 11/13/20 2045 Oral     SpO2 11/13/20 2045 98 %     Weight 11/13/20 2045 136 lb (61.7 kg)     Height 11/13/20 2045 5\' 3"  (1.6 m)     Head Circumference --      Peak Flow --      Pain Score 11/13/20 2052 8     Pain Loc --      Pain Edu? --      Excl. in GC? --     Constitutional: Alert and oriented. Well appearing and in no apparent distress. HEENT:  Head: Normocephalic and atraumatic.         Eyes: Conjunctivae are normal. Sclera is non-icteric.       Mouth/Throat: Mucous membranes are moist.       Neck: Supple with no signs of meningismus. Cardiovascular: Regular rate and rhythm. No murmurs, gallops, or rubs. 2+ symmetrical distal pulses are present in all extremities. No JVD. Respiratory: Normal respiratory effort. Lungs are clear to auscultation bilaterally.  Gastrointestinal: Soft, mild left pelvic tenderness, and non distended with positive bowel sounds. No rebound or guarding. Pelvic exam: Normal external genitalia, no rashes or  lesions. Normal cervical mucus. Os closed. No cervical motion tenderness.  No uterine or adnexal tenderness.   Musculoskeletal:  No edema, cyanosis, or erythema of extremities. Neurologic: Normal speech and language. Face is symmetric. Moving all extremities. No gross focal neurologic deficits are appreciated. Skin: Skin is warm, dry and intact. No rash noted. Psychiatric: Mood and affect are normal. Speech and behavior are normal.  ____________________________________________   LABS (all labs ordered are listed, but only abnormal results are displayed)  Labs Reviewed  WET PREP, GENITAL - Abnormal; Notable for the following components:      Result Value   WBC, Wet Prep HPF POC MODERATE (*)    All other components within normal limits  CBC - Abnormal; Notable for the following components:   RBC 3.71 (*)    Hemoglobin 10.0 (*)    HCT 31.7 (*)    All other components within normal limits  URINALYSIS, COMPLETE (UACMP) WITH MICROSCOPIC - Abnormal; Notable for the following components:   Color, Urine YELLOW (*)    APPearance CLEAR (*)    Leukocytes,Ua TRACE (*)    All other components within normal limits  CHLAMYDIA/NGC RT PCR ([facility] ONLY)  LIPASE, BLOOD  COMPREHENSIVE METABOLIC PANEL  HCG, QUANTITATIVE, PREGNANCY  POC URINE PREG, ED   ____________________________________________  EKG  none  ____________________________________________  RADIOLOGY  I have personally reviewed the images performed during this visit and I agree with the Radiologist's read.   Interpretation by Radiologist:  US PELVIC COMPLETE W TRANSVAGINAL AND TORSION R/O  Result Date: 11/14/2020 CLINICAL DATA:  Left lower quadrant abdominal pain, LMP 10/30/2020 EXAM: TRANSABDOMINAL AND TRANSVAGINAL ULTRASOUND OF PELVIS DOPPLER ULTRASOUND OF OVARIES TECHNIQUE: Both transabdominal and transvaginal ultrasound examinations of the pelvis were performed. Transabdominal technique was performed for global  imaging of the pelvis including uterus, ovaries, adnexal regions, and pelvic cul-de-sac. It was necessary to proceed with endovaginal exam following the transabdominal exam to visualize the endometrium and ovaries bilaterally. Color and duplex Doppler ultrasound was utilized to evaluate blood flow to the ovaries. COMPARISON:  None. FINDINGS: Uterus Measurements: 7.3 x 3.8 x 4.9 cm = volume: 71 mL. No fibroids or other mass visualized. Endometrium Thickness: 15 mm.  No focal abnormality visualized. Right ovary Measurements: 3.8 x 2.6 x 2.7 cm = volume: 14 mL. Normal appearance/no adnexal mass. Left ovary Measurements: 4.0 x 3.2 x 3.7 cm = volume: 24 mL. a.m. 3.0 x 2.6 x 2.9 cm simple cyst is identified within the left ovary likely representing a follicular cyst. No adnexal masses are seen. Pulsed Doppler evaluation of both ovaries demonstrates normal low-resistance arterial and venous waveforms. Other findings No abnormal free fluid. IMPRESSION: 3 cm left ovarian simple cyst most in keeping with a follicular cyst. No followup imaging recommended. Note: This recommendation does not apply to premenarchal patients or to those with increased risk (genetic, family history, elevated tumor markers or other high-risk factors) of  ovarian cancer. Reference: Radiology 2019 Nov; 293(2):359-371. Otherwise normal pelvic sonogram Electronically Signed   By: Helyn Numbers MD   On: 11/14/2020 01:04     ____________________________________________   PROCEDURES  Procedure(s) performed: None Procedures Critical Care performed:  None ____________________________________________   INITIAL IMPRESSION / ASSESSMENT AND PLAN / ED COURSE  20 y.o. female with history of migraines who presents for evaluation of abdominal pain.  1 week of left lower quadrant constant achy and sharp pain.  Patient is extremely well-appearing in no distress with normal vital signs, abdomen is soft with mild tenderness in the left pelvic region with  no rebound or guarding.  Ddx ovarian cyst, ovarian torsion, tubo-ovarian abscess, PID, STD, UTI, pregnancy, ectopic pregnancy, diverticulitis.  Pelvic exam with no acute findings.  Ultrasound visualized by me showing a left ovarian cyst with no signs of torsion, confirmed by radiology.  Pregnancy test negative.  UA with no evidence of UTI.  Blood work otherwise normal with stable mild anemia, no leukocytosis, normal metabolic panel, LFTs and lipase.  Patient received ibuprofen with resolution of her pain.  Will discharge home on supportive care and follow-up with OB/GYN.  Pelvic swabs are pending.   _________________________ 3:21 AM on 11/14/2020 -----------------------------------------  STD swabs negative.       _____________________________________________ Please note:  Patient was evaluated in Emergency Department today for the symptoms described in the history of present illness. Patient was evaluated in the context of the global COVID-19 pandemic, which necessitated consideration that the patient might be at risk for infection with the SARS-CoV-2 virus that causes COVID-19. Institutional protocols and algorithms that pertain to the evaluation of patients at risk for COVID-19 are in a state of rapid change based on information released by regulatory bodies including the CDC and federal and state organizations. These policies and algorithms were followed during the patient's care in the ED.  Some ED evaluations and interventions may be delayed as a result of limited staffing during the pandemic.   [place] Controlled Substance Database was reviewed by me. ____________________________________________   FINAL CLINICAL IMPRESSION(S) / ED DIAGNOSES   Final diagnoses:  LLQ abdominal pain  Cyst of left ovary      NEW MEDICATIONS STARTED DURING THIS VISIT:  ED Discharge Orders    None       Note:  This document was prepared using Dragon voice recognition software and may include  unintentional dictation errors.    Don Perking, [place], MD 11/14/20 [phone]

## 2020-11-21 ENCOUNTER — Other Ambulatory Visit: Payer: Self-pay

## 2020-11-21 ENCOUNTER — Ambulatory Visit (INDEPENDENT_AMBULATORY_CARE_PROVIDER_SITE_OTHER): Payer: Medicaid Other | Admitting: Certified Nurse Midwife

## 2020-11-21 ENCOUNTER — Encounter: Payer: Self-pay | Admitting: Certified Nurse Midwife

## 2020-11-21 VITALS — BP 114/73 | HR 78 | Resp 16 | Ht 63.0 in | Wt 138.1 lb

## 2020-11-21 DIAGNOSIS — Z319 Encounter for procreative management, unspecified: Secondary | ICD-10-CM

## 2020-11-21 DIAGNOSIS — N83202 Unspecified ovarian cyst, left side: Secondary | ICD-10-CM

## 2020-11-21 MED ORDER — VITAFOL GUMMIES 3.33-0.333-34.8 MG PO CHEW: 3.3300 mg | CHEWABLE_TABLET | Freq: Every day | ORAL | 3 refills | Status: DC

## 2020-11-21 MED ORDER — ACETAMINOPHEN 500 MG PO TABS: 1000.0000 mg | ORAL_TABLET | Freq: Four times a day (QID) | ORAL | 0 refills | Status: DC | PRN

## 2020-11-21 NOTE — Progress Notes (Signed)
GYN ENCOUNTER NOTE  Subjective:       Claudia Frederick is a 20 y.o. G0P0000 female is here for gynecologic evaluation of the following issues:  1. ER follow up-Seen at [facility] on 11/14/2020 and diagnosed with left ovarian cyst  2. Desires pregnancy, questions fertility-"trying for the last nine (9) months" without success  Denies difficulty breathing or respiratory distress, chest pain, dysuria, and leg pain or swelling.    Gynecologic History  Patient's last menstrual period was 10/30/2020 (approximate).  Contraception: none  Last Pap: N/A  Obstetric History  OB History  Gravida Para Term Preterm AB Living  0 0 0 0 0 0  SAB IAB Ectopic Multiple Live Births  0 0 0 0 0    Past Medical History:  Diagnosis Date  . Migraines     Past Surgical History:  Procedure Laterality Date  . no surgical history      Current Outpatient Medications on File Prior to Visit  Medication Sig Dispense Refill  . Prenatal Vit-Fe Phos-FA-Omega (VITAFOL GUMMIES) 3.33-0.333-34.8 MG CHEW Chew 3.33 mg by mouth daily. 90 tablet 3  . [DISCONTINUED] norelgestromin-ethinyl estradiol [employer] [facility]) 150-35 MCG/24HR transdermal patch Place 1 patch onto the skin once a week. 9 patch 4   No current facility-administered medications on file prior to visit.    No Known Allergies  Social History   Socioeconomic History  . Marital status: Single    Spouse name: Not on file  . Number of children: Not on file  . Years of education: Not on file  . Highest education level: Not on file  Occupational History  . Not on file  Tobacco Use  . Smoking status: Never Smoker  . Smokeless tobacco: Never Used  Vaping Use  . Vaping Use: Never used  Substance and Sexual Activity  . Alcohol use: Never  . Drug use: Never  . Sexual activity: Yes    Birth control/protection: None  Other Topics Concern  . Not on file  Social History Narrative  . Not on file   Social Determinants of Health   Financial  Resource Strain: Not on file  Food Insecurity: Not on file  Transportation Needs: Not on file  Physical Activity: Not on file  Stress: Not on file  Social Connections: Not on file  Intimate Partner Violence: Not on file    Family History  Problem Relation Age of Onset  . Migraines Mother   . Anxiety disorder Father   . Migraines Maternal Grandmother   . Healthy Paternal Grandmother   . Healthy Paternal Grandfather     The following portions of the patient's history were reviewed and updated as appropriate: allergies, current medications, past family history, past medical history, past social history, past surgical history and problem list.  Review of Systems  ROS negative except as noted above. Information obtained from patient.   Objective:   BP 114/73   Pulse 78   Resp 16   Ht 5\' 3"  (1.6 m)   Wt 138 lb 1.6 oz (62.6 kg)   LMP 10/30/2020 (Approximate)   BMI 24.46 kg/m    CONSTITUTIONAL: Well-developed, well-nourished female in no acute distress.   PHYSICAL EXAM: Not indicated.    TRANSABDOMINAL AND TRANSVAGINAL ULTRASOUND OF PELVIS  DATA:  Left lower quadrant abdominal pain, LMP 10/30/2020  DOPPLER ULTRASOUND OF OVARIES  TECHNIQUE: Both transabdominal and transvaginal ultrasound examinations of the pelvis were performed. Transabdominal technique was performed for global imaging of the pelvis including uterus, ovaries, adnexal  regions, and pelvic cul-de-sac.  It was necessary to proceed with endovaginal exam following the transabdominal exam to visualize the endometrium and ovaries bilaterally. Color and duplex Doppler ultrasound was utilized to evaluate blood flow to the ovaries.  COMPARISON:  None.  FINDINGS: Uterus  Measurements: 7.3 x 3.8 x 4.9 cm = volume: 71 mL. No fibroids or other mass visualized.  Endometrium  Thickness: 15 mm.  No focal abnormality visualized.  Right ovary  Measurements: 3.8 x 2.6 x 2.7 cm = volume: 14 mL.  Normal appearance/no adnexal mass.  Left ovary  Measurements: 4.0 x 3.2 x 3.7 cm = volume: 24 mL. a.m. 3.0 x 2.6 x 2.9 cm simple cyst is identified within the left ovary likely representing a follicular cyst. No adnexal masses are seen.  Pulsed Doppler evaluation of both ovaries demonstrates normal low-resistance arterial and venous waveforms.  Other findings  No abnormal free fluid.  IMPRESSION: 3 cm left ovarian simple cyst most in keeping with a follicular cyst. No followup imaging recommended. Note: This recommendation does not apply to premenarchal patients or to those with increased risk (genetic, family history, elevated tumor markers or other high-risk factors) of ovarian cancer. Reference: Radiology 2019 Nov; 293(2):359-371.  Otherwise normal pelvic sonogram   Assessment:   1. Left ovarian cyst   2. Patient desires pregnancy   Plan:   Ultrasound finding discussed with patient, verbalized understanding.   Encouraged well timed intercourse, cycle tracking and use of ovulation test strips.   Rx Vitafol gummies and Tylenol, see orders.   Reviewed red flag symptoms and when to call.   RTC x 3-4 months for follow up or sooner if needed.    Serafina Royals, CNM [facility], [facility] 11/21/20 3:03 PM

## 2020-11-21 NOTE — Patient Instructions (Signed)
Preparing for Pregnancy If you are planning to become pregnant, talk to your health care provider about preconception care. This type of care helps you prepare for a safe and healthy pregnancy. During this visit, your health care provider will:  Do a complete physical exam, including a Pap test.  Take your complete medical history.  Give you information, answer your questions, and help you resolve problems. Preconception checklist Medical history  Tell your health care provider about any medical conditions you have or have had. Your pregnancy or your ability to become pregnant may be affected by long-term (chronic) conditions, such as: ? Diabetes. ? High blood pressure (hypertension). ? Thyroid problems.  Tell your health care provider about your family's medical history and your partner's medical history.  Tell your health care provider if you have or have had any sexually transmitted infections, orSTIs. These can affect your pregnancy. In some cases, they can be passed to your baby.  If needed, discuss the benefits of genetic testing. This test checks for conditions that may be passed from parent to child.  Tell your health care provider about: ? Any problems you had getting pregnant or while pregnant. ? Any medicines you take. These include vitamins, herbal supplements, and over-the-counter medicines. ? Your history of getting vaccines. Discuss any vaccines that you may need. Diet  Ask your health care provider about what foods to eat in order to get a balance of nutrients. This is especially important when you are pregnant or preparing to become pregnant. It is recommended that women of childbearing age take a folic acid supplement of 400 mcg daily and eat foods rich in folic acid to prevent certain birth defects.  Ask your health care provider to help you reach a healthy weight before pregnancy. ? If you are overweight, you may have a higher risk for certain problems. These  include hypertension, diabetes, and early (preterm) birth. ? If you are underweight, you are more likely to have a baby who has a low birth weight. Lifestyle, work, and home Let your health care provider know about:  Any lifestyle habits that you have, such as use of alcohol, drugs, or tobacco products.  Fun and leisure activities that may put you at risk during pregnancy, such as downhill skiing and certain exercise programs.  Any plans to travel out of the country, especially to places with an active [place] virus outbreak.  Harmful substances that you may be exposed to at work or at home. These include chemicals, pesticides, radiation, and substances from cat litter boxes.  Any concerns you have for your safety at home. Mental health Tell your health care provider about:  Any history of mental health conditions, including feelings of depression, sadness, or anxiety.  Any medicines that you take for a mental health condition. These include herbs and supplements. How do I know that I am pregnant? You may be pregnant if you have been sexually active and you miss your period. Other symptoms of early pregnancy include:  Mild cramping.  Very light vaginal bleeding (spotting).  Feeling more tired than usual.  Nausea and vomiting. These may be signs of morning sickness. Take a home pregnancy test if you have any of these symptoms. This test checks for a hormone in your urine called human chorionic gonadotropin, or hCG. A woman's body begins to make this hormone during early pregnancy. These tests are very accurate. Wait until at least the first day after you miss your period to take a home pregnancy  test. If the test shows that you are pregnant, call your health care provider for a prenatal care visit. What should I do if I become pregnant?  Schedule a visit with your health care provider as soon as you suspect you are pregnant.  Talk to your health care provider if you are taking  prescription medicines to determine if they are safe to take during pregnancy.  You may continue to have sex if it does not cause pain or other problems, such as vaginal bleeding. Follow these instructions at home: Eating and drinking  Follow instructions from your health care provider about eating or drinking restrictions.  Drink enough fluid to keep your urine pale yellow.  Eat a balanced diet. This includes fresh fruits and vegetables, whole grains, lean meats, low-fat dairy products, healthy fats, and foods that are high in fiber. Ask to meet with a nutritionist or registered dietitian for help with meal planning and goals.  Avoid eating raw or undercooked meat and seafood.  Avoid eating or drinking unpasteurized dairy products.   Lifestyle  Get regular exercise. Try to be active for at least 30 minutes a day on most days of the week. Ask your health care provider which activities are safe during pregnancy.  Maintain a healthy weight.  Avoid toxic fumes and chemicals.  Avoid cleaning cat litter boxes. Cat feces may contain a harmful parasite called toxoplasma.  Avoid travel to countries where [place] virus is common.  Do not use any products that contain nicotine or tobacco, such as cigarettes, e-cigarettes, and chewing tobacco. If you need help quitting, ask your health care provider.  Do not drink alcohol or use drugs.      General instructions  Keep an accurate record of your menstrual periods. This makes it easier for your health care provider to determine your baby's due date.  Take over-the-counter and prescription medicines only as told by your health care provider.  Begin taking prenatal vitamins and folic acid supplements daily as directed.  Manage any chronic conditions, such as hypertension and diabetes, as told by your health care provider. This is important. Summary  If you are planning to become pregnant, talk to your health care provider about preconception  care. This is an important part of planning for a healthy pregnancy.  Women of childbearing age should take 400 mcg of folic acid daily in addition to eating a diet rich in folic acid. This will prevent certain birth defects.  Schedule a visit with your health care provider as soon as you suspect you are pregnant. Tell your health care provider about your medical history, lifestyle activities, home safety, and other things that may concern you. This information is not intended to replace advice given to you by your health care provider. Make sure you discuss any questions you have with your health care provider. Document Revised: 04/04/2019 Document Reviewed: 04/04/2019 Elsevier Patient Education  2021 Elsevier Inc.   Ovarian Cyst  An ovarian cyst is a fluid-filled sac on an ovary. Most of these cysts go away on their own and are not cancer. Some cysts need treatment. What are the causes?  Ovarian hyperstimulation syndrome. Some medicines may lead to this problem.  Polycystic ovarian syndrome (PCOS). Problems with body chemicals (hormones) can lead to this condition.  The normal menstrual cycle. What increases the risk?  Being overweight or very overweight.  Taking medicines to increase your chance of getting pregnant.  Using some types of birth control.  Smoking. What are the  signs or symptoms? Many ovarian cysts do not cause symptoms. If you get symptoms, you may have:  Pain or pressure in the area between the hip bones.  Pain in the lower belly.  Pain during sex.  Swelling in the lower belly.  Periods that are not regular.  Pain with periods. How is this treated? Many ovarian cysts go away on their own without treatment. If you need treatment, it may include:  Medicines for pain.  Fluid taken out of the cyst.  The cyst being taken out.  Birth control pills or other medicines.  Surgery to remove the ovary. Follow these instructions at home:  Take  over-the-counter and prescription medicines only as told by your doctor.  Ask your doctor if you should avoid driving or using machines while you are taking your medicine.  Get exams and Pap tests as told by your doctor.  Return to your normal activities when your doctor says that it is safe.  Do not smoke or use any products that contain nicotine or tobacco. If you need help quitting, ask your doctor.  Keep all follow-up visits. Contact a doctor if:  Your periods: ? Are late. ? Are not regular. ? Stop. ? Are painful.  You have pain in the area between your hip bones, and the pain does not go away.  You feel pressure on your bladder.  You have trouble peeing.  You feel full, or your belly hurts, swells, or bloats.  You gain or lose weight without trying, or you are less hungry than normal.  You feel pain and pressure in your back.  You feel pain and pressure in the area between your hip bones.  You think you may be pregnant. Get help right away if:  You have pain in your belly that is very bad or gets worse.  You have pain in the area between your hip bones, and the pain is very bad or gets worse.  You cannot eat or drink without vomiting.  You get a fever or chills all of a sudden.  Your period is a lot heavier than usual. Summary  An ovarian cyst is a fluid-filled sac on an ovary.  Some cysts may cause problems and need treatment.  Most of these cysts go away on their own. This information is not intended to replace advice given to you by your health care provider. Make sure you discuss any questions you have with your health care provider. Document Revised: 12/13/2019 Document Reviewed: 12/13/2019 Elsevier Patient Education  2021 [employer].

## 2021-01-08 ENCOUNTER — Encounter: Payer: Self-pay | Admitting: Certified Nurse Midwife

## 2021-01-08 ENCOUNTER — Ambulatory Visit (INDEPENDENT_AMBULATORY_CARE_PROVIDER_SITE_OTHER): Payer: Medicaid Other | Admitting: Certified Nurse Midwife

## 2021-01-08 ENCOUNTER — Other Ambulatory Visit: Payer: Self-pay

## 2021-01-08 VITALS — BP 118/79 | HR 70 | Resp 16 | Ht 63.0 in | Wt 134.6 lb

## 2021-01-08 DIAGNOSIS — Z8742 Personal history of other diseases of the female genital tract: Secondary | ICD-10-CM

## 2021-01-08 DIAGNOSIS — R1032 Left lower quadrant pain: Secondary | ICD-10-CM

## 2021-01-08 NOTE — Progress Notes (Signed)
GYN ENCOUNTER NOTE  Subjective:       Claudia Frederick is a 20 y.o. G0P0000 female is here for gynecologic evaluation of the following issues:  1. Reports left lower quadrant/pelvic pain since cyst identified on ultrasound approximately two (2) months ago; no relief with home treatment measures.   Patient desires pregnancy.   Denies difficulty breathing or respiratory distress, chest pain, dysuria and leg pain or swelling.    Gynecologic History  Patient's last menstrual period was 12/31/2020.  Contraception: none  Last Pap: N/A.   Obstetric History  OB History  Gravida Para Term Preterm AB Living  0 0 0 0 0 0  SAB IAB Ectopic Multiple Live Births  0 0 0 0 0    Past Medical History:  Diagnosis Date   Migraines     Past Surgical History:  Procedure Laterality Date   no surgical history      Current Outpatient Medications on File Prior to Visit  Medication Sig Dispense Refill   acetaminophen (TYLENOL) 500 MG tablet Take 2 tablets (1,000 mg total) by mouth every 6 (six) hours as needed. 30 tablet 0   Prenatal Vit-Fe Phos-FA-Omega (VITAFOL GUMMIES) 3.33-0.333-34.8 MG CHEW Chew 3.33 mg by mouth daily. 90 tablet 3   [DISCONTINUED] norelgestromin-ethinyl estradiol [employer]) 150-35 MCG/24HR transdermal patch Place 1 patch onto the skin once a week. 9 patch 4   No current facility-administered medications on file prior to visit.    No Known Allergies  Social History   Socioeconomic History   Marital status: Single    Spouse name: Not on file   Number of children: Not on file   Years of education: Not on file   Highest education level: Not on file  Occupational History   Not on file  Tobacco Use   Smoking status: Never   Smokeless tobacco: Never  Vaping Use   Vaping Use: Never used  Substance and Sexual Activity   Alcohol use: Never   Drug use: Never   Sexual activity: Yes    Birth control/protection: None  Other Topics Concern   Not on file   Social History Narrative   Not on file   Social Determinants of Health   Financial Resource Strain: Not on file  Food Insecurity: Not on file  Transportation Needs: Not on file  Physical Activity: Not on file  Stress: Not on file  Social Connections: Not on file  Intimate Partner Violence: Not on file    Family History  Problem Relation Age of Onset   Migraines Mother    Anxiety disorder Father    Migraines Maternal Grandmother    Healthy Paternal Grandmother    Healthy Paternal Grandfather     The following portions of the patient's history were reviewed and updated as appropriate: allergies, current medications, past family history, past medical history, past social history, past surgical history and problem list.  Review of Systems  ROS negative except as noted above. Information obtained from patient.   Objective:   BP 118/79   Pulse 70   Resp 16   Ht 5\' 3"  (1.6 m)   Wt 134 lb 9.6 oz (61.1 kg)   LMP 12/31/2020   BMI 23.84 kg/m   CONSTITUTIONAL: Well-developed, well-nourished female in no acute distress.   ABDOMEN: Soft, non distended; Non tender except for right lower quadrant.  No Organomegaly.  MUSCULOSKELETAL: Normal range of motion. No tenderness.  No cyanosis, clubbing, or edema.   Assessment:   1. History  of ovarian cyst  - US PELVIC COMPLETE WITH TRANSVAGINAL; Future  2. Left lower quadrant pain  - US PELVIC COMPLETE WITH TRANSVAGINAL; Future    Plan:   Patient requests cyst removal. Declines trial of OCPs at this time due to desire for pregnancy.   Education regarding ovarian cyst, see AVS.   Will schedule repeat ultrasound and follow up according, see orders.    Serafina Royals, CNM [facility]

## 2021-01-08 NOTE — Patient Instructions (Signed)
Ovarian Cyst °An ovarian cyst is a fluid-filled sac on an ovary. Most of these cysts go away on their own and are not cancer. Some cysts need treatment. °What are the causes? °Ovarian hyperstimulation syndrome. Some medicines may lead to this problem. °Polycystic ovarian syndrome (PCOS). Problems with body chemicals (hormones) can lead to this condition. °The normal menstrual cycle. °What increases the risk? °Being overweight or very overweight. °Taking medicines to increase your chance of getting pregnant. °Using some types of birth control. °Smoking. °What are the signs or symptoms? °Many ovarian cysts do not cause symptoms. If you get symptoms, you may have: °Pain or pressure in the area between the hip bones. °Pain in the lower belly. °Pain during sex. °Swelling in the lower belly. °Periods that are not regular. °Pain with periods. °How is this treated? °Many ovarian cysts go away on their own without treatment. If you need treatment, it may include: °Medicines for pain. °Fluid taken out of the cyst. °The cyst being taken out. °Birth control pills or other medicines. °Surgery to remove the ovary. °Follow these instructions at home: °Take over-the-counter and prescription medicines only as told by your doctor. °Ask your doctor if you should avoid driving or using machines while you are taking your medicine. °Get exams and Pap tests as told by your doctor. °Return to your normal activities when your doctor says that it is safe. °Do not smoke or use any products that contain nicotine or tobacco. If you need help quitting, ask your doctor. °Keep all follow-up visits. °Contact a doctor if: °Your periods: °Are late. °Are not regular. °Stop. °Are painful. °You have pain in the area between your hip bones, and the pain does not go away. °You feel pressure on your bladder. °You have trouble peeing. °You feel full, or your belly hurts, swells, or bloats. °You gain or lose weight without trying, or you are less hungry than  normal. °You feel pain and pressure in your back. °You feel pain and pressure in the area between your hip bones. °You think you may be pregnant. °Get help right away if: °You have pain in your belly that is very bad or gets worse. °You have pain in the area between your hip bones, and the pain is very bad or gets worse. °You cannot eat or drink without vomiting. °You get a fever or chills all of a sudden. °Your period is a lot heavier than usual. °Summary °An ovarian cyst is a fluid-filled sac on an ovary. °Some cysts may cause problems and need treatment. °Most of these cysts go away on their own. °This information is not intended to replace advice given to you by your health care provider. Make sure you discuss any questions you have with your health care provider. °Document Revised: 12/13/2019 Document Reviewed: 12/13/2019 °Elsevier Patient Education © 2022 Elsevier Inc. ° °

## 2021-01-21 ENCOUNTER — Other Ambulatory Visit: Payer: Self-pay

## 2021-01-21 ENCOUNTER — Ambulatory Visit
Admission: EM | Admit: 2021-01-21 | Discharge: 2021-01-21 | Disposition: A | Discharge status: Home / Self Care | Payer: Medicaid Other | Attending: Sports Medicine | Admitting: Sports Medicine

## 2021-01-21 DIAGNOSIS — Z79899 Other long term (current) drug therapy: Secondary | ICD-10-CM | POA: Insufficient documentation

## 2021-01-21 DIAGNOSIS — R509 Fever, unspecified: Secondary | ICD-10-CM | POA: Diagnosis not present

## 2021-01-21 DIAGNOSIS — M791 Myalgia, unspecified site: Secondary | ICD-10-CM

## 2021-01-21 DIAGNOSIS — R52 Pain, unspecified: Secondary | ICD-10-CM

## 2021-01-21 DIAGNOSIS — B349 Viral infection, unspecified: Secondary | ICD-10-CM

## 2021-01-21 DIAGNOSIS — J3489 Other specified disorders of nose and nasal sinuses: Secondary | ICD-10-CM | POA: Diagnosis not present

## 2021-01-21 DIAGNOSIS — J069 Acute upper respiratory infection, unspecified: Secondary | ICD-10-CM | POA: Diagnosis not present

## 2021-01-21 DIAGNOSIS — R059 Cough, unspecified: Secondary | ICD-10-CM | POA: Diagnosis not present

## 2021-01-21 DIAGNOSIS — Z20822 Contact with and (suspected) exposure to covid-19: Secondary | ICD-10-CM | POA: Diagnosis not present

## 2021-01-21 DIAGNOSIS — R0981 Nasal congestion: Secondary | ICD-10-CM

## 2021-01-21 DIAGNOSIS — J029 Acute pharyngitis, unspecified: Secondary | ICD-10-CM | POA: Diagnosis not present

## 2021-01-21 DIAGNOSIS — R519 Headache, unspecified: Secondary | ICD-10-CM

## 2021-01-21 LAB — SARS CORONAVIRUS 2 (TAT 6-24 HRS): SARS Coronavirus 2: NEGATIVE

## 2021-01-21 MED ORDER — ACYCLOVIR 5 % EX OINT: 1.0000 "application " | TOPICAL_OINTMENT | CUTANEOUS | 0 refills | Status: DC

## 2021-01-21 MED ORDER — FLUTICASONE PROPIONATE 50 MCG/ACT NA SUSP: 2.0000 | Freq: Every day | NASAL | 0 refills | Status: DC

## 2021-01-21 MED ORDER — PREDNISOLONE 5 MG PO TABS: 5.0000 mg | ORAL_TABLET | Freq: Every morning | ORAL | 0 refills | Status: DC

## 2021-01-21 MED ORDER — PROMETHAZINE-DM 6.25-15 MG/5ML PO SYRP: 5.0000 mL | ORAL_SOLUTION | Freq: Four times a day (QID) | ORAL | 0 refills | Status: DC | PRN

## 2021-01-21 MED ORDER — METHYLPREDNISOLONE SODIUM SUCC 40 MG IJ SOLR: 80.0000 mg | Freq: Once | INTRAMUSCULAR | Status: DC

## 2021-01-21 NOTE — Discharge Instructions (Addendum)
As we discussed, you have a URI or an upper respiratory infection.  This may be COVID-related.  We did swab you and it was sent to the hospital.  Please follow along on MyChart. Please isolate until you know your results.  If you are positive you will need to quarantine for at least 5 days per the current CDC guidelines. I did provide you with a work note. I also provided you with a cough medicine as well as a nasal spray to help with your symptoms. Over-the-counter meds such as Mucinex, Delsym, Robitussin, Tylenol or Motrin for fever or discomfort are recommended. Plenty of rest and plenty of fluids. If symptoms persist please see your primary care provider. If your symptoms worsen please go to the emergency room.

## 2021-01-21 NOTE — ED Triage Notes (Signed)
Pt presents with body aches, HA, nasal congestion, cough, scratchy throat x 1 day.  Took Tylenol and theraflu at home PTA with relief.  Felt feverish but unsure of temp. No known exposure, requests COVID testing.

## 2021-01-21 NOTE — ED Provider Notes (Signed)
[facility]    CSN: [id number] Arrival date & time: 01/21/21  1105      History   Chief Complaint Chief Complaint  Patient presents with   Generalized Body Aches    HPI Ziomara Birenbaum is a 20 y.o. female.   Patient is a 20 year old female who presents for evaluation of URI symptoms.  She reports body aches, fever, congestion, rhinorrhea, myalgias, sore throat, cough, and headache since yesterday.  She has been taking Tylenol and TheraFlu at home.  No documented temperature but she feels warm and has some chills.  She denies any chest pain or shortness of breath.  No COVID or flu exposure.  No history of asthma.  She does not note a wheeze or shortness of breath.  She has been vaccinated x2 but has not received the booster.  No flu shot.  She denies any chest pain or any red flag signs or symptoms.  She is requesting COVID testing.  She works in a [employer] and does not have a primary care physician.   Past Medical History:  Diagnosis Date   Migraines     There are no problems to display for this patient.   Past Surgical History:  Procedure Laterality Date   no surgical history      OB History     Gravida  0   Para  0   Term  0   Preterm  0   AB  0   Living  0      SAB  0   IAB  0   Ectopic  0   Multiple  0   Live Births  0            Home Medications    Prior to Admission medications   Medication Sig Start Date End Date Taking? Authorizing Provider  acetaminophen (TYLENOL) 500 MG tablet Take 2 tablets (1,000 mg total) by mouth every 6 (six) hours as needed. 11/21/20  Yes Lawhorn, Vanessa [place], CNM  fluticasone (FLONASE) 50 MCG/ACT nasal spray Place 2 sprays into both nostrils daily. 01/21/21  Yes Delton See, MD  Prenatal Vit-Fe Phos-FA-Omega (VITAFOL GUMMIES) 3.33-0.333-34.8 MG CHEW Chew 3.33 mg by mouth daily. 11/21/20  Yes Lawhorn, Vanessa [place], CNM  promethazine-dextromethorphan (PROMETHAZINE-DM) 6.25-15 MG/5ML  syrup Take 5 mLs by mouth 4 (four) times daily as needed for cough. 01/21/21  Yes Delton See, MD  norelgestromin-ethinyl estradiol [facility]) 150-35 MCG/24HR transdermal patch Place 1 patch onto the skin once a week. 10/08/19 02/25/20  Gunnar Bulla, CNM    Family History Family History  Problem Relation Age of Onset   Migraines Mother    Anxiety disorder Father    Migraines Maternal Grandmother    Healthy Paternal Grandmother    Healthy Paternal Grandfather     Social History Social History   Tobacco Use   Smoking status: Never   Smokeless tobacco: Never  Vaping Use   Vaping Use: Never used  Substance Use Topics   Alcohol use: Never   Drug use: Never     Allergies   Patient has no known allergies.   Review of Systems Review of Systems  Constitutional:  Positive for activity change, chills, fatigue and fever. Negative for appetite change and diaphoresis.  HENT:  Positive for congestion, rhinorrhea and sore throat. Negative for ear pain, postnasal drip, sinus pressure, sinus pain and sneezing.   Eyes:  Negative for pain.  Respiratory:  Positive for cough. Negative for chest tightness,  shortness of breath and wheezing.   Cardiovascular:  Negative for chest pain and palpitations.  Gastrointestinal:  Negative for abdominal pain, diarrhea, nausea and vomiting.  Genitourinary:  Negative for dysuria.  Musculoskeletal:  Positive for myalgias. Negative for back pain and neck pain.  Skin:  Negative for color change, pallor, rash and wound.  Neurological:  Positive for headaches. Negative for dizziness, syncope, light-headedness and numbness.  All other systems reviewed and are negative.   Physical Exam Triage Vital Signs ED Triage Vitals [01/21/21 1147]  Enc Vitals Group     BP 111/78     Pulse Rate 91     Resp 18     Temp 98 F (36.7 C)     Temp Source Oral     SpO2 100 %     Weight      Height      Head Circumference      Peak Flow      Pain Score 0      Pain Loc      Pain Edu?      Excl. in GC?    No data found.  Updated Vital Signs BP 111/78 (BP Location: Left Arm)   Pulse 91   Temp 98 F (36.7 C) (Oral)   Resp 18   LMP 12/31/2020   SpO2 100%   Visual Acuity Right Eye Distance:   Left Eye Distance:   Bilateral Distance:    Right Eye Near:   Left Eye Near:    Bilateral Near:     Physical Exam Vitals and nursing note reviewed.  Constitutional:      General: She is not in acute distress.    Appearance: Normal appearance. She is not ill-appearing, toxic-appearing or diaphoretic.  HENT:     Head: Normocephalic and atraumatic.     Right Ear: Tympanic membrane normal.     Left Ear: Tympanic membrane normal.     Nose: Congestion and rhinorrhea present.     Mouth/Throat:     Mouth: Mucous membranes are moist.     Pharynx: Posterior oropharyngeal erythema present. No oropharyngeal exudate.  Eyes:     General: No scleral icterus.       Right eye: No discharge.        Left eye: No discharge.     Extraocular Movements: Extraocular movements intact.     Conjunctiva/sclera: Conjunctivae normal.     Pupils: Pupils are equal, round, and reactive to light.  Cardiovascular:     Rate and Rhythm: Normal rate and regular rhythm.     Pulses: Normal pulses.     Heart sounds: Normal heart sounds. No murmur heard.   No friction rub. No gallop.  Pulmonary:     Effort: Pulmonary effort is normal.     Breath sounds: Normal breath sounds. No stridor. No wheezing, rhonchi or rales.  Musculoskeletal:     Cervical back: Normal range of motion and neck supple.  Skin:    General: Skin is warm and dry.     Capillary Refill: Capillary refill takes less than 2 seconds.     Coloration: Skin is not jaundiced.     Findings: No erythema, lesion or rash.  Neurological:     General: No focal deficit present.     Mental Status: She is alert and oriented to person, place, and time.     UC Treatments / Results  Labs (all labs ordered are  listed, but only abnormal results are displayed) Labs Reviewed  SARS CORONAVIRUS 2 (TAT 6-24 HRS)    EKG   Radiology No results found.  Procedures Procedures (including critical care time)  Medications Ordered in UC Medications - No data to display  Initial Impression / Assessment and Plan / UC Course  I have reviewed the triage vital signs and the nursing notes.  Pertinent labs & imaging results that were available during my care of the patient were reviewed by me and considered in my medical decision making (see chart for details).  Clinical impression: Upper respiratory tract infection with myalgias, body aches, cough, low-grade fever, nasal congestion, headache.  Treatment plan: 1.  The findings and treatment plan were discussed in detail with the patient.  Patient was in agreement. 2.  Went ahead and swabbed her for COVID and will send it off to the hospital.  I have asked her to follow along in MyChart.  Someone will contact her if she is positive.  I have asked her to isolate until she knows the results.  If she is positive she will need to quarantine for at least 5 days per the current CDC guidelines. 3.  I did give her a work note. 4.  I did prescribe a cough medicine for cough and a nasal spray for her nasal congestion.  She will take that as directed. 5.  Educational handouts provided. 6.  Over-the-counter meds such as Mucinex, Delsym, Robitussin, Tylenol or Motrin as needed.  Plenty of rest and plenty of fluids. 7.  If symptoms persist that she needs to see her PCP. 8.  If symptoms worsen she needs to go to the ER. 9.  She was discharged in stable condition and will follow-up here as needed.    Final Clinical Impressions(s) / UC Diagnoses   Final diagnoses:  Upper respiratory tract infection, unspecified type  Body aches  Cough  Febrile illness, acute  Nasal congestion  Headache due to viral infection  Myalgia     Discharge Instructions      As we  discussed, you have a URI or an upper respiratory infection.  This may be COVID-related.  We did swab you and it was sent to the hospital.  Please follow along on MyChart. Please isolate until you know your results.  If you are positive you will need to quarantine for at least 5 days per the current CDC guidelines. I did provide you with a work note. I also provided you with a cough medicine as well as a nasal spray to help with your symptoms. Over-the-counter meds such as Mucinex, Delsym, Robitussin, Tylenol or Motrin for fever or discomfort are recommended. Plenty of rest and plenty of fluids. If symptoms persist please see your primary care provider. If your symptoms worsen please go to the emergency room.     ED Prescriptions     Medication Sig Dispense Auth. Provider   acyclovir ointment (ZOVIRAX) 5 %  (Status: Discontinued) Apply 1 application topically every 3 (three) hours. 15 g Delton See, MD   prednisoLONE 5 MG TABS tablet  (Status: Discontinued) Take 1 tablet (5 mg total) by mouth AC breakfast. 30 mg on day 1, 25 mg on day 2, 20 mg on day 3, 15 mg on day 4, 10 mg a day 5, and 5 mg on day 6 21 tablet Delton See, MD   promethazine-dextromethorphan (PROMETHAZINE-DM) 6.25-15 MG/5ML syrup Take 5 mLs by mouth 4 (four) times daily as needed for cough. 180 mL Delton See, MD   fluticasone [facility]) 50 MCG/ACT  nasal spray Place 2 sprays into both nostrils daily. 15.8 mL Delton SeeBarnes, Rakeem Colley, MD      PDMP not reviewed this encounter.   Delton SeeBarnes, Taavi Hoose, MD 01/22/21 [phone]

## 2021-02-09 ENCOUNTER — Other Ambulatory Visit: Payer: Self-pay

## 2021-02-09 ENCOUNTER — Ambulatory Visit
Admission: EM | Admit: 2021-02-09 | Discharge: 2021-02-09 | Disposition: A | Discharge status: Home / Self Care | Payer: Medicaid Other | Attending: Family Medicine | Admitting: Family Medicine

## 2021-02-09 ENCOUNTER — Encounter: Payer: Self-pay | Admitting: Emergency Medicine

## 2021-02-09 DIAGNOSIS — Z3A01 Less than 8 weeks gestation of pregnancy: Secondary | ICD-10-CM | POA: Diagnosis not present

## 2021-02-09 DIAGNOSIS — Z3201 Encounter for pregnancy test, result positive: Secondary | ICD-10-CM | POA: Diagnosis not present

## 2021-02-09 DIAGNOSIS — R519 Headache, unspecified: Secondary | ICD-10-CM | POA: Diagnosis not present

## 2021-02-09 DIAGNOSIS — R110 Nausea: Secondary | ICD-10-CM | POA: Insufficient documentation

## 2021-02-09 LAB — PREGNANCY, URINE: Preg Test, Ur: POSITIVE — AB

## 2021-02-09 NOTE — ED Provider Notes (Signed)
[facility]    CSN: [id number] Arrival date & time: 02/09/21  4098      History   Chief Complaint Chief Complaint  Patient presents with   Headache   Nausea   HPI  20 year old female presents with concerns that she may be pregnant.  Patient reports that she has had intermittent nausea and intermittent headaches for the past 2 to 3 weeks.  Last menstrual period was 12/26/2020.  Patient states she took a home pregnancy test yesterday and it was positive.  Patient is concerned she may be pregnant.  Patient denies vaginal bleeding.  She states that she has had some abdominal cramping.  No other associated symptoms.  Would like confirmation of pregnancy today.  Home Medications    Prior to Admission medications   Medication Sig Start Date End Date Taking? Authorizing Provider  acetaminophen (TYLENOL) 500 MG tablet Take 2 tablets (1,000 mg total) by mouth every 6 (six) hours as needed. 11/21/20   Lawhorn, Vanessa [place], CNM  fluticasone (FLONASE) 50 MCG/ACT nasal spray Place 2 sprays into both nostrils daily. 01/21/21   Delton See, MD  Prenatal Vit-Fe Phos-FA-Omega (VITAFOL GUMMIES) 3.33-0.333-34.8 MG CHEW Chew 3.33 mg by mouth daily. 11/21/20   Gunnar Bulla, CNM  promethazine-dextromethorphan (PROMETHAZINE-DM) 6.25-15 MG/5ML syrup Take 5 mLs by mouth 4 (four) times daily as needed for cough. 01/21/21   Delton See, MD  norelgestromin-ethinyl estradiol [facility]) 150-35 MCG/24HR transdermal patch Place 1 patch onto the skin once a week. 10/08/19 02/25/20  Gunnar Bulla, CNM    Family History Family History  Problem Relation Age of Onset   Migraines Mother    Anxiety disorder Father    Migraines Maternal Grandmother    Healthy Paternal Grandmother    Healthy Paternal Grandfather     Social History Social History   Tobacco Use   Smoking status: Never   Smokeless tobacco: Never  Vaping Use   Vaping Use: Never used  Substance Use Topics    Alcohol use: Never   Drug use: Never     Allergies   Patient has no known allergies.   Review of Systems Review of Systems Per HPI  Physical Exam Triage Vital Signs ED Triage Vitals  Enc Vitals Group     BP 02/09/21 0942 114/75     Pulse Rate 02/09/21 0942 79     Resp 02/09/21 0942 20     Temp 02/09/21 0942 98.4 F (36.9 C)     Temp Source 02/09/21 0942 Oral     SpO2 02/09/21 0942 100 %     Weight --      Height --      Head Circumference --      Peak Flow --      Pain Score 02/09/21 0938 4     Pain Loc --      Pain Edu? --      Excl. in GC? --    Updated Vital Signs BP 114/75 (BP Location: Left Arm)   Pulse 79   Temp 98.4 F (36.9 C) (Oral)   Resp 20   LMP 12/26/2020   SpO2 100%   Visual Acuity Right Eye Distance:   Left Eye Distance:   Bilateral Distance:    Right Eye Near:   Left Eye Near:    Bilateral Near:     Physical Exam Vitals and nursing note reviewed.  Constitutional:      General: She is not in acute distress.    Appearance:  Normal appearance. She is not ill-appearing.  HENT:     Head: Normocephalic and atraumatic.  Eyes:     General:        Right eye: No discharge.        Left eye: No discharge.     Conjunctiva/sclera: Conjunctivae normal.  Cardiovascular:     Rate and Rhythm: Normal rate.  Pulmonary:     Effort: Pulmonary effort is normal.     Breath sounds: Normal breath sounds. No wheezing, rhonchi or rales.  Neurological:     Mental Status: She is alert.  Psychiatric:        Mood and Affect: Mood normal.        Behavior: Behavior normal.    UC Treatments / Results  Labs (all labs ordered are listed, but only abnormal results are displayed) Labs Reviewed  PREGNANCY, URINE - Abnormal; Notable for the following components:      Result Value   Preg Test, Ur POSITIVE (*)    All other components within normal limits    EKG   Radiology No results found.  Procedures Procedures (including critical care  time)  Medications Ordered in UC Medications - No data to display  Initial Impression / Assessment and Plan / UC Course  I have reviewed the triage vital signs and the nursing notes.  Pertinent labs & imaging results that were available during my care of the patient were reviewed by me and considered in my medical decision making (see chart for details).    20 year old female presents with positive home pregnancy test.  Patient reporting intermittent headaches and nausea.  She states that she is doing well at this time.  Pregnancy confirmed with a positive test here.  Advised to follow-up with OB/GYN.  She already has appointments lined up for this week.  Continue prenatal vitamin.  Supportive care.  Final Clinical Impressions(s) / UC Diagnoses   Final diagnoses:  Less than [redacted] weeks gestation of pregnancy     Discharge Instructions      Please see OBGYN for prenatal care.  Take care  Dr. Adriana Simas      ED Prescriptions   None    PDMP not reviewed this encounter.   Tommie Sams, DO 02/09/21 1101

## 2021-02-09 NOTE — ED Triage Notes (Addendum)
Pt presents today with c/o of headache and nausea x 2-3 weeks. Denies v/d or fever. She took home pregnancy test yesterday, positive. LMP: 12/26/20.   Covid test 3 weeks ago, negative.   Pt requests to have pregnancy confirmed with urine test today.

## 2021-02-09 NOTE — Discharge Instructions (Addendum)
Please see OBGYN for prenatal care.  Take care  Dr. Adriana Simas

## 2021-02-10 ENCOUNTER — Telehealth: Payer: Self-pay

## 2021-02-10 NOTE — Telephone Encounter (Signed)
Patient called she is having mild sharp cramps on her lower right side. Pain comes and goes, it started today. She denies bleeding or leakage of fluid. She has been working and standing on her feet for long periods of time. She has been staying hydrated. I advised patient that cramping can be normal during pregnancy.I also told her that is could be round ligament pain that she is experiencing. She just wants to be sure that everything is ok and she is requesting an u/s. I told her that we would not be able to provide an u/s at her appointment tomorrow. Please advise.

## 2021-02-11 ENCOUNTER — Ambulatory Visit (INDEPENDENT_AMBULATORY_CARE_PROVIDER_SITE_OTHER): Payer: Medicaid Other | Admitting: Obstetrics and Gynecology

## 2021-02-11 ENCOUNTER — Ambulatory Visit: Admission: RE | Admit: 2021-02-11 | Payer: Medicaid Other | Source: Ambulatory Visit

## 2021-02-11 ENCOUNTER — Encounter: Payer: Self-pay | Admitting: Obstetrics and Gynecology

## 2021-02-11 ENCOUNTER — Other Ambulatory Visit: Payer: Self-pay

## 2021-02-11 VITALS — BP 115/80 | HR 88 | Ht 63.0 in | Wt 137.9 lb

## 2021-02-11 DIAGNOSIS — R102 Pelvic and perineal pain: Secondary | ICD-10-CM | POA: Diagnosis not present

## 2021-02-11 NOTE — Progress Notes (Signed)
HPI:      [name] is a 20 y.o. G1P0000 who LMP was Patient's last menstrual period was 12/31/2020.  Subjective:   She presents today because she began having pelvic crampy pain and has a positive pregnancy test.  She denies vaginal bleeding.  She says the pain is not severe.  She does have a history of an ovarian cyst and she says the pain is similar to her previous ovarian cyst.  She has an ultrasound scheduled today for follow-up of her ovarian cyst and I have asked her to keep that appointment. She was attempting pregnancy and is excited about being pregnant.  She is currently taking prenatal vitamins.  She has some nausea but says that she has not begun vomiting.    Hx: The following portions of the patient's history were reviewed and updated as appropriate:             She  has a past medical history of Migraines. She does not have a problem list on file. She  has a past surgical history that includes no surgical history. Her family history includes Anxiety disorder in her father; Healthy in her paternal grandfather and paternal grandmother; Migraines in her maternal grandmother and mother. She  reports that she has never smoked. She has never used smokeless tobacco. She reports that she does not drink alcohol and does not use drugs. She has a current medication list which includes the following prescription(s): acetaminophen, vitafol gummies, fluticasone, promethazine-dextromethorphan, and [DISCONTINUED] xulane. She has No Known Allergies.       Review of Systems:  Review of Systems  Constitutional: Denied constitutional symptoms, night sweats, recent illness, fatigue, fever, insomnia and weight loss.  Eyes: Denied eye symptoms, eye pain, photophobia, vision change and visual disturbance.  Ears/Nose/Throat/Neck: Denied ear, nose, throat or neck symptoms, hearing loss, nasal discharge, sinus congestion and sore throat.  Cardiovascular: Denied cardiovascular  symptoms, arrhythmia, chest pain/pressure, edema, exercise intolerance, orthopnea and palpitations.  Respiratory: Denied pulmonary symptoms, asthma, pleuritic pain, productive sputum, cough, dyspnea and wheezing.  Gastrointestinal: Denied, gastro-esophageal reflux, melena, nausea and vomiting.  Genitourinary: Denied genitourinary symptoms including symptomatic vaginal discharge, pelvic relaxation issues, and urinary complaints.  Musculoskeletal: Denied musculoskeletal symptoms, stiffness, swelling, muscle weakness and myalgia.  Dermatologic: Denied dermatology symptoms, rash and scar.  Neurologic: Denied neurology symptoms, dizziness, headache, neck pain and syncope.  Psychiatric: Denied psychiatric symptoms, anxiety and depression.  Endocrine: Denied endocrine symptoms including hot flashes and night sweats.   Meds:   Current Outpatient Medications on File Prior to Visit  Medication Sig Dispense Refill   acetaminophen (TYLENOL) 500 MG tablet Take 2 tablets (1,000 mg total) by mouth every 6 (six) hours as needed. 30 tablet 0   Prenatal Vit-Fe Phos-FA-Omega (VITAFOL GUMMIES) 3.33-0.333-34.8 MG CHEW Chew 3.33 mg by mouth daily. 90 tablet 3   fluticasone (FLONASE) 50 MCG/ACT nasal spray Place 2 sprays into both nostrils daily. (Patient not taking: Reported on 02/11/2021) 15.8 mL 0   promethazine-dextromethorphan (PROMETHAZINE-DM) 6.25-15 MG/5ML syrup Take 5 mLs by mouth 4 (four) times daily as needed for cough. (Patient not taking: Reported on 02/11/2021) 180 mL 0   [DISCONTINUED] norelgestromin-ethinyl estradiol [facility]) 150-35 MCG/24HR transdermal patch Place 1 patch onto the skin once a week. 9 patch 4   No current facility-administered medications on file prior to visit.      Objective:     Vitals:   02/11/21 1414  BP: 115/80  Pulse: 88   Filed Weights  02/11/21 1414  Weight: 137 lb 14.4 oz (62.6 kg)                Assessment:    G1P0000 There are no problems to display for  this patient.    1. Pelvic pain in female     Pelvic pain likely secondary to rapid uterine enlargement with early pregnancy.  May also be secondary to corpus luteum ovarian cyst.  Doubt miscarriage.   Plan:            Prenatal Plan 1.  The patient was given prenatal literature. 2.  She was continued on prenatal vitamins. 3.  A prenatal lab panel to be drawn at nurse visit. 4.  Advised patient to keep ultrasound appointment today for pregnancy dating purposes and fetal viability. 5.  A nurse visit was scheduled. 6.  Genetic testing and testing for other inheritable conditions discussed in detail. She will decide in the future whether to have these labs performed. 7.  A general overview of pregnancy testing, visit schedule, ultrasound schedule, and prenatal care was discussed. 8.  COVID and its risks associated with pregnancy, prevention by limiting exposure and use of masks, as well as the risks and benefits of vaccination during pregnancy were discussed in detail.  [facility] policy regarding office and hospital visitation and testing was explained. 9.  Benefits of breast-feeding discussed in detail including both maternal and infant benefits. Ready Set Baby website discussed.  Orders No orders of the defined types were placed in this encounter.   No orders of the defined types were placed in this encounter.     F/U  Return in about 4 weeks (around 03/11/2021). I spent 31 minutes involved in the care of this patient preparing to see the patient by obtaining and reviewing her medical history (including labs, imaging tests and prior procedures), documenting clinical information in the electronic health record (EHR), counseling and coordinating care plans, writing and sending prescriptions, ordering tests or procedures and directly communicating with the patient by discussing pertinent items from her history and physical exam as well as detailing my assessment and plan as noted above so that  she has an informed understanding.  All of her questions were answered.  Elonda Husky, M.D. 02/11/2021 2:52 PM

## 2021-02-12 ENCOUNTER — Other Ambulatory Visit: Payer: Self-pay

## 2021-02-12 ENCOUNTER — Other Ambulatory Visit: Payer: Self-pay | Admitting: Certified Nurse Midwife

## 2021-02-12 ENCOUNTER — Encounter: Payer: Self-pay | Admitting: Emergency Medicine

## 2021-02-12 ENCOUNTER — Ambulatory Visit
Admission: RE | Admit: 2021-02-12 | Discharge: 2021-02-12 | Disposition: A | Discharge status: Home / Self Care | Payer: Medicaid Other | Source: Ambulatory Visit | Attending: Certified Nurse Midwife | Admitting: Certified Nurse Midwife

## 2021-02-12 DIAGNOSIS — M545 Low back pain, unspecified: Secondary | ICD-10-CM | POA: Diagnosis not present

## 2021-02-12 DIAGNOSIS — R1032 Left lower quadrant pain: Secondary | ICD-10-CM | POA: Insufficient documentation

## 2021-02-12 DIAGNOSIS — Z5321 Procedure and treatment not carried out due to patient leaving prior to being seen by health care provider: Secondary | ICD-10-CM | POA: Diagnosis not present

## 2021-02-12 DIAGNOSIS — Z3A01 Less than 8 weeks gestation of pregnancy: Secondary | ICD-10-CM | POA: Diagnosis not present

## 2021-02-12 DIAGNOSIS — Z3201 Encounter for pregnancy test, result positive: Secondary | ICD-10-CM

## 2021-02-12 DIAGNOSIS — O99891 Other specified diseases and conditions complicating pregnancy: Secondary | ICD-10-CM | POA: Insufficient documentation

## 2021-02-12 DIAGNOSIS — N898 Other specified noninflammatory disorders of vagina: Secondary | ICD-10-CM | POA: Insufficient documentation

## 2021-02-12 DIAGNOSIS — O26891 Other specified pregnancy related conditions, first trimester: Secondary | ICD-10-CM | POA: Diagnosis not present

## 2021-02-12 DIAGNOSIS — Z8742 Personal history of other diseases of the female genital tract: Secondary | ICD-10-CM

## 2021-02-12 NOTE — ED Triage Notes (Signed)
Pt to ED from home c/o left lower back pain radiating to left pelvic region 2 days ago, had some relief yesterday but returned again today.  Pt states has had some watery vaginal discharge, denies vaginal bleeding or urinary changes.  States [redacted] weeks pregnant, first pregnancy, care established at [facility].

## 2021-02-13 ENCOUNTER — Emergency Department
Admission: EM | Admit: 2021-02-13 | Discharge: 2021-02-13 | Disposition: A | Discharge status: Home / Self Care | Payer: Medicaid Other | Source: Home / Self Care

## 2021-02-13 ENCOUNTER — Emergency Department
Admission: EM | Admit: 2021-02-13 | Discharge: 2021-02-13 | Disposition: A | Discharge status: Home / Self Care | Payer: Medicaid Other | Attending: Emergency Medicine | Admitting: Emergency Medicine

## 2021-02-13 ENCOUNTER — Encounter: Payer: Self-pay | Admitting: Emergency Medicine

## 2021-02-13 ENCOUNTER — Other Ambulatory Visit: Payer: Self-pay

## 2021-02-13 DIAGNOSIS — O99891 Other specified diseases and conditions complicating pregnancy: Secondary | ICD-10-CM | POA: Insufficient documentation

## 2021-02-13 DIAGNOSIS — M545 Low back pain, unspecified: Secondary | ICD-10-CM | POA: Insufficient documentation

## 2021-02-13 DIAGNOSIS — Z5321 Procedure and treatment not carried out due to patient leaving prior to being seen by health care provider: Secondary | ICD-10-CM | POA: Insufficient documentation

## 2021-02-13 DIAGNOSIS — Z3A01 Less than 8 weeks gestation of pregnancy: Secondary | ICD-10-CM | POA: Insufficient documentation

## 2021-02-13 HISTORY — DX: Unspecified ovarian cyst, unspecified side: N83.209

## 2021-02-13 LAB — LIPASE, BLOOD: Lipase: 35 U/L (ref 11–51)

## 2021-02-13 LAB — CBC
HCT: 30.3 % — ABNORMAL LOW (ref 36.0–46.0)
Hemoglobin: 10.1 g/dL — ABNORMAL LOW (ref 12.0–15.0)
MCH: 27.7 pg (ref 26.0–34.0)
MCHC: 33.3 g/dL (ref 30.0–36.0)
MCV: 83 fL (ref 80.0–100.0)
Platelets: 247 10*3/uL (ref 150–400)
RBC: 3.65 MIL/uL — ABNORMAL LOW (ref 3.87–5.11)
RDW: 15.2 % (ref 11.5–15.5)
WBC: 6.8 10*3/uL (ref 4.0–10.5)
nRBC: 0 % (ref 0.0–0.2)

## 2021-02-13 LAB — COMPREHENSIVE METABOLIC PANEL
ALT: 11 U/L (ref 0–44)
AST: 14 U/L — ABNORMAL LOW (ref 15–41)
Albumin: 3.9 g/dL (ref 3.5–5.0)
Alkaline Phosphatase: 43 U/L (ref 38–126)
Anion gap: 5 (ref 5–15)
BUN: 13 mg/dL (ref 6–20)
CO2: 24 mmol/L (ref 22–32)
Calcium: 9.1 mg/dL (ref 8.9–10.3)
Chloride: 107 mmol/L (ref 98–111)
Creatinine, Ser: 0.55 mg/dL (ref 0.44–1.00)
GFR, Estimated: >60 mL/min (ref >60)
Glucose, Bld: 107 mg/dL — ABNORMAL HIGH (ref 70–99)
Potassium: 3.9 mmol/L (ref 3.5–5.1)
Sodium: 136 mmol/L (ref 135–145)
Total Bilirubin: 0.4 mg/dL (ref 0.3–1.2)
Total Protein: 7.9 g/dL (ref 6.5–8.1)

## 2021-02-13 NOTE — ED Triage Notes (Signed)
Pt in with pain to low back, reports she is [redacted]wks pregnant, LMP 6/10. States she noticed clear vaginal fluid last night when she went to bathroom

## 2021-02-13 NOTE — ED Notes (Signed)
Pt called via cell phone to come back to room.  Pt states she has left the hospital at this time.

## 2021-02-18 ENCOUNTER — Encounter: Payer: Medicaid Other | Admitting: Obstetrics and Gynecology

## 2021-03-13 ENCOUNTER — Ambulatory Visit (INDEPENDENT_AMBULATORY_CARE_PROVIDER_SITE_OTHER): Payer: Medicaid Other | Admitting: Obstetrics and Gynecology

## 2021-03-13 ENCOUNTER — Other Ambulatory Visit: Payer: Self-pay

## 2021-03-13 VITALS — BP 114/72 | HR 80 | Resp 16 | Ht 63.0 in | Wt 136.1 lb

## 2021-03-13 DIAGNOSIS — Z3A10 10 weeks gestation of pregnancy: Secondary | ICD-10-CM | POA: Diagnosis not present

## 2021-03-13 DIAGNOSIS — Z113 Encounter for screening for infections with a predominantly sexual mode of transmission: Secondary | ICD-10-CM | POA: Diagnosis not present

## 2021-03-13 DIAGNOSIS — Z3401 Encounter for supervision of normal first pregnancy, first trimester: Secondary | ICD-10-CM

## 2021-03-13 LAB — POCT URINALYSIS DIPSTICK OB
Bilirubin, UA: NEGATIVE
Blood, UA: NEGATIVE
Glucose, UA: NEGATIVE
Leukocytes, UA: NEGATIVE
Nitrite, UA: NEGATIVE
Spec Grav, UA: 1.01 (ref 1.010–1.025)
Urobilinogen, UA: 0.2 E.U./dL
pH, UA: 7 (ref 5.0–8.0)

## 2021-03-13 NOTE — Patient Instructions (Signed)
Commonly Asked Questions During Pregnancy  Cats: A parasite can be excreted in cat feces.  To avoid exposure you need to have another person empty the little box.  If you must empty the litter box you will need to wear gloves.  Wash your hands after handling your cat.  This parasite can also be found in raw or undercooked meat so this should also be avoided.  Colds, Sore Throats, Flu: Please check your medication sheet to see what you can take for symptoms.  If your symptoms are unrelieved by these medications please call the office.  Dental Work: Most any dental work your dentist recommends is permitted.  X-rays should only be taken during the first trimester if absolutely necessary.  Your abdomen should be shielded with a lead apron during all x-rays.  Please notify your provider prior to receiving any x-rays.  Novocaine is fine; gas is not recommended.  If your dentist requires a note from us prior to dental work please call the office and we will provide one for you.  Exercise: Exercise is an important part of staying healthy during your pregnancy.  You may continue most exercises you were accustomed to prior to pregnancy.  Later in your pregnancy you will most likely notice you have difficulty with activities requiring balance like riding a bicycle.  It is important that you listen to your body and avoid activities that put you at a higher risk of falling.  Adequate rest and staying well hydrated are a must!  If you have questions about the safety of specific activities ask your provider.    Exposure to Children with illness: Try to avoid obvious exposure; report any symptoms to us when noted,  If you have chicken pos, red measles or mumps, you should be immune to these diseases.   Please do not take any vaccines while pregnant unless you have checked with your OB provider.  Fetal Movement: After 28 weeks we recommend you do "kick counts" twice daily.  Lie or sit down in a calm quiet environment and  count your baby movements "kicks".  You should feel your baby at least 10 times per hour.  If you have not felt 10 kicks within the first hour get up, walk around and have something sweet to eat or drink then repeat for an additional hour.  If count remains less than 10 per hour notify your provider.  Fumigating: Follow your pest control agent's advice as to how long to stay out of your home.  Ventilate the area well before re-entering.  Hemorrhoids:   Most over-the-counter preparations can be used during pregnancy.  Check your medication to see what is safe to use.  It is important to use a stool softener or fiber in your diet and to drink lots of liquids.  If hemorrhoids seem to be getting worse please call the office.   Hot Tubs:  Hot tubs Jacuzzis and saunas are not recommended while pregnant.  These increase your internal body temperature and should be avoided.  Intercourse:  Sexual intercourse is safe during pregnancy as long as you are comfortable, unless otherwise advised by your provider.  Spotting may occur after intercourse; report any bright red bleeding that is heavier than spotting.  Labor:  If you know that you are in labor, please go to the hospital.  If you are unsure, please call the office and let us help you decide what to do.  Lifting, straining, etc:  If your job requires heavy   lifting or straining please check with your provider for any limitations.  Generally, you should not lift items heavier than that you can lift simply with your hands and arms (no back muscles)  Painting:  Paint fumes do not harm your pregnancy, but may make you ill and should be avoided if possible.  Latex or water based paints have less odor than oils.  Use adequate ventilation while painting.  Permanents & Hair Color:  Chemicals in hair dyes are not recommended as they cause increase hair dryness which can increase hair loss during pregnancy.  " Highlighting" and permanents are allowed.  Dye may be  absorbed differently and permanents may not hold as well during pregnancy.  Sunbathing:  Use a sunscreen, as skin burns easily during pregnancy.  Drink plenty of fluids; avoid over heating.  Tanning Beds:  Because their possible side effects are still unknown, tanning beds are not recommended.  Ultrasound Scans:  Routine ultrasounds are performed at approximately 20 weeks.  You will be able to see your baby's general anatomy an if you would like to know the gender this can usually be determined as well.  If it is questionable when you conceived you may also receive an ultrasound early in your pregnancy for dating purposes.  Otherwise ultrasound exams are not routinely performed unless there is a medical necessity.  Although you can request a scan we ask that you pay for it when conducted because insurance does not cover " patient request" scans.  Work: If your pregnancy proceeds without complications you may work until your due date, unless your physician or employer advises otherwise.  Round Ligament Pain/Pelvic Discomfort:  Sharp, shooting pains not associated with bleeding are fairly common, usually occurring in the second trimester of pregnancy.  They tend to be worse when standing up or when you remain standing for long periods of time.  These are the result of pressure of certain pelvic ligaments called "round ligaments".  Rest, Tylenol and heat seem to be the most effective relief.  As the womb and fetus grow, they rise out of the pelvis and the discomfort improves.  Please notify the office if your pain seems different than that described.  It may represent a more serious condition.  [email or web address].[email or web address]">  First Trimester of Pregnancy  The first trimester of pregnancy starts on the first day of your last menstrual period until the end of week 12. This is also called months 1 through 3 ofpregnancy. Body changes during your first trimester Your body goes through many  changes during pregnancy. The changes usuallyreturn to normal after your baby is born. Physical changes You may gain or lose weight. Your breasts may grow larger and hurt. The area around your nipples may get darker. Dark spots or blotches may develop on your face. You may have changes in your hair. Health changes You may feel like you might vomit (nauseous), and you may vomit. You may have heartburn. You may have headaches. You may have trouble pooping (constipation). Your gums may bleed. Other changes You may get tired easily. You may pee (urinate) more often. Your menstrual periods will stop. You may not feel hungry. You may want to eat certain kinds of food. You may have changes in your emotions from day to day. You may have more dreams. Follow these instructions at home: Medicines Take over-the-counter and prescription medicines only as told by your doctor. Some medicines are not safe during pregnancy. Take a prenatal vitamin that contains at least 600  micrograms (mcg) of folic acid. Eating and drinking Eat healthy meals that include: Fresh fruits and vegetables. Whole grains. Good sources of protein, such as meat, eggs, or tofu. Low-fat dairy products. Avoid raw meat and unpasteurized juice, milk, and cheese. If you feel like you may vomit, or you vomit: Eat 4 or 5 small meals a day instead of 3 large meals. Try eating a few soda crackers. Drink liquids between meals instead of during meals. You may need to take these actions to prevent or treat trouble pooping: Drink enough fluids to keep your pee (urine) pale yellow. Eat foods that are high in fiber. These include beans, whole grains, and fresh fruits and vegetables. Limit foods that are high in fat and sugar. These include fried or sweet foods. Activity Exercise only as told by your doctor. Most people can do their usual exercise routine during pregnancy. Stop exercising if you have cramps or pain in your lower belly  (abdomen) or low back. Do not exercise if it is too hot or too humid, or if you are in a place of great height (high altitude). Avoid heavy lifting. If you choose to, you may have sex unless your doctor tells you not to. Relieving pain and discomfort Wear a good support bra if your breasts are sore. Rest with your legs raised (elevated) if you have leg cramps or low back pain. If you have bulging veins (varicose veins) in your legs: Wear support hose as told by your doctor. Raise your feet for 15 minutes, 3-4 times a day. Limit salt in your food. Safety Wear your seat belt at all times when you are in a car. Talk with your doctor if someone is hurting you or yelling at you. Talk with your doctor if you are feeling sad or have thoughts of hurting yourself. Lifestyle Do not use hot tubs, steam rooms, or saunas. Do not douche. Do not use tampons or scented sanitary pads. Do not use herbal medicines, illegal drugs, or medicines that are not approved by your doctor. Do not drink alcohol. Do not smoke or use any products that contain nicotine or tobacco. If you need help quitting, ask your doctor. Avoid cat litter boxes and soil that is used by cats. These carry germs that can cause harm to the baby and can cause a loss of your baby by miscarriage or stillbirth. General instructions Keep all follow-up visits. This is important. Ask for help if you need counseling or if you need help with nutrition. Your doctor can give you advice or tell you where to go for help. Visit your dentist. At home, brush your teeth with a soft toothbrush. Floss gently. Write down your questions. Take them to your prenatal visits. Where to find more information American Pregnancy Association: americanpregnancy.org [employer]: www.acog.org Office on Women's Health: MightyReward.co.nz Contact a doctor if: You are dizzy. You have a fever. You have mild cramps or  pressure in your lower belly. You have a nagging pain in your belly area. You continue to feel like you may vomit, you vomit, or you have watery poop (diarrhea) for 24 hours or longer. You have a bad-smelling fluid coming from your vagina. You have pain when you pee. You are exposed to a disease that spreads from person to person, such as chickenpox, measles, Zika virus, HIV, or hepatitis. Get help right away if: You have spotting or bleeding from your vagina. You have very bad belly cramping or pain. You have  shortness of breath or chest pain. You have any kind of injury, such as from a fall or a car crash. You have new or increased pain, swelling, or redness in an arm or leg. Summary The first trimester of pregnancy starts on the first day of your last menstrual period until the end of week 12 (months 1 through 3). Eat 4 or 5 small meals a day instead of 3 large meals. Do not smoke or use any products that contain nicotine or tobacco. If you need help quitting, ask your doctor. Keep all follow-up visits. This information is not intended to replace advice given to you by your health care provider. Make sure you discuss any questions you have with your healthcare provider. Document Revised: 12/12/2019 Document Reviewed: 10/18/2019 Elsevier Patient Education  2022 Elsevier Inc. Morning Sickness  Morning sickness is when you feel like you may vomit (feel nauseous) during pregnancy. Sometimes, you may vomit. Morning sickness most often happens in the morning, but it can also happen at any time of the day. Some women may have morning sickness that makes them vomit all the time. This is amore serious problem that needs treatment. What are the causes? The cause of this condition is not known. What increases the risk? You had vomiting or a feeling like you may vomit before your pregnancy. You had morning sickness in another pregnancy. You are pregnant with more than one baby, such as  twins. What are the signs or symptoms? Feeling like you may vomit. Vomiting. How is this treated? Treatment is usually not needed for this condition. You may only need to change what you eat. In some cases, your doctor may give you some things to take for your condition. These include: Vitamin B6 supplements. Medicines to treat the feeling that you may vomit. Ginger. Follow these instructions at home: Medicines Take over-the-counter and prescription medicines only as told by your doctor. Do not take any medicines until you talk with your doctor about them first. Take multivitamins before you get pregnant. These can stop or lessen the symptoms of morning sickness. Eating and drinking Eat dry toast or crackers before getting out of bed. Eat 5 or 6 small meals a day. Eat dry and bland foods like rice and baked potatoes. Do not eat greasy, fatty, or spicy foods. Have someone cook for you if the smell of food causes you to vomit or to feel like you may vomit. If you feel like you may vomit after taking prenatal vitamins, take them at night or with a snack. Eat protein foods when you need a snack. Nuts, yogurt, and cheese are good choices. Drink fluids throughout the day. Try ginger ale made with real ginger, ginger tea made from fresh grated ginger, or ginger candies. General instructions Do not smoke or use any products that contain nicotine or tobacco. If you need help quitting, ask your doctor. Use an air purifier to keep the air in your house free of smells. Get lots of fresh air. Try to avoid smells that make you feel sick. Try wearing an acupressure wristband. This is a wristband that is used to treat seasickness. Try a treatment called acupuncture. In this treatment, a doctor puts needles into certain areas of your body to make you feel better. Contact a doctor if: You need medicine to feel better. You feel dizzy or light-headed. You are losing weight. Get help right away if: The  feeling that you may vomit will not go away, or you cannot  stop vomiting. You faint. You have very bad pain in your belly. Summary Morning sickness is when you feel like you may vomit (feel nauseous) during pregnancy. You may feel sick in the morning, but you can feel this way at any time of the day. Making some changes to what you eat may help your symptoms go away. This information is not intended to replace advice given to you by your health care provider. Make sure you discuss any questions you have with your healthcare provider. Document Revised: 02/18/2020 Document Reviewed: 01/28/2020 Elsevier Patient Education  2022 Elsevier Inc. [employer]  [address] [place], [place], [place] [address]  Phone: [phone]  [facility] (second location)  [address] [place], [place] [address]  Phone: [phone]  [facility]) [address] [place], [place], [place] [address] Phone: [phone]  [facility]  [address], [place], [place] [address]  Phone: [phone] Class  December 29, 2016  Wednesday 7:00p - 9:00p  [facility] [place], [place]  February 02, 2017  Wednesday 7:00p - 9:00p [facility] [place], [place]    March 09, 2017   Wednesday 7:00p - 9:000p [facility] [place], [place]  April 06, 2017  Wednesday  7:00p - 9:00p [facility] [place], [place]  May 04, 2017 Wednesday 7:00p - 9:00p [facility] [place], [place]  Interested in a waterbirth?  This informational class will help you discover whether waterbirth is the right fit for you.  Education about waterbirth itself, supplies you would need and how to assemble your support team is what you can expect from this class.  Some obstetrical practices require this class in order to pursue a waterbirth.  (Not all obstetrical  practices offer waterbirth check with your healthcare provider)  Register only the expectant mom, but you are encouraged to bring your partner to class!  Fees & Payment No fee  Register Online [email or web address] Search [place]

## 2021-03-13 NOTE — Progress Notes (Signed)
Sela Hilding presents for NOB nurse interview visit. Pregnancy confirmation done 02/09/2021.  G1P0000    . Pregnancy education material explained and given. 0 cats in the home. NOB labs ordered. HIV labs and Drug screen were explained optional and she did not decline. Drug screen ordered. PNV encouraged. Genetic screening options discussed. Genetic testing: Ordered. She is having a surprise reveal, please call her brother Randall Hiss @ [phone] with the results.  Pt may discuss with provider.  Financial policy reviewed. FMLA from reviewed and signed. Pt. To follow up with provider in 3 weeks for NOB physical.  All questions answered.

## 2021-03-14 LAB — MICROSCOPIC EXAMINATION
Casts: NONE SEEN /lpf
Epithelial Cells (non renal): >10 /hpf — AB (ref 0–10)

## 2021-03-14 LAB — URINALYSIS, ROUTINE W REFLEX MICROSCOPIC
Bilirubin, UA: NEGATIVE
Glucose, UA: NEGATIVE
Ketones, UA: NEGATIVE
Nitrite, UA: NEGATIVE
RBC, UA: NEGATIVE
Specific Gravity, UA: 1.025 (ref 1.005–1.030)
Urobilinogen, Ur: 0.2 mg/dL (ref 0.2–1.0)
pH, UA: 7 (ref 5.0–7.5)

## 2021-03-15 LAB — URINE CULTURE, OB REFLEX

## 2021-03-15 LAB — PAIN MGT SCRN (14 DRUGS), UR
Amphetamine Scrn, Ur: NEGATIVE ng/mL
BARBITURATE SCREEN URINE: NEGATIVE ng/mL
BENZODIAZEPINE SCREEN, URINE: NEGATIVE ng/mL
Buprenorphine, Urine: NEGATIVE ng/mL
CANNABINOIDS UR QL SCN: NEGATIVE ng/mL
Cocaine (Metab) Scrn, Ur: NEGATIVE ng/mL
Creatinine(Crt), U: 179.6 mg/dL (ref 20.0–300.0)
Fentanyl, Urine: NEGATIVE pg/mL
Meperidine Screen, Urine: NEGATIVE ng/mL
Methadone Screen, Urine: NEGATIVE ng/mL
OXYCODONE+OXYMORPHONE UR QL SCN: NEGATIVE ng/mL
Opiate Scrn, Ur: NEGATIVE ng/mL
Ph of Urine: 6.9 (ref 4.5–8.9)
Phencyclidine Qn, Ur: NEGATIVE ng/mL
Propoxyphene Scrn, Ur: NEGATIVE ng/mL
Tramadol Screen, Urine: NEGATIVE ng/mL

## 2021-03-15 LAB — NICOTINE SCREEN, URINE: Cotinine Ql Scrn, Ur: NEGATIVE ng/mL

## 2021-03-15 LAB — CULTURE, OB URINE

## 2021-03-16 ENCOUNTER — Telehealth: Payer: Self-pay

## 2021-03-16 LAB — RUBELLA SCREEN: Rubella Antibodies, IGG: 1.89 index (ref >0.99)

## 2021-03-16 LAB — ANTIBODY SCREEN: Antibody Screen: NEGATIVE

## 2021-03-16 LAB — TOXOPLASMA ANTIBODIES- IGG AND  IGM
Toxoplasma Antibody- IgM: 4.8 AU/mL (ref 0.0–7.9)
Toxoplasma IgG Ratio: <3 IU/mL (ref 0.0–7.1)

## 2021-03-16 LAB — VIRAL HEPATITIS HBV, HCV
HCV Ab: <0.1 s/co ratio (ref 0.0–0.9)
Hep B Core Total Ab: NEGATIVE
Hep B Surface Ab, Qual: NONREACTIVE
Hepatitis B Surface Ag: NEGATIVE

## 2021-03-16 LAB — ABO AND RH: Rh Factor: POSITIVE

## 2021-03-16 LAB — HCV INTERPRETATION

## 2021-03-16 LAB — PARVOVIRUS B19 ANTIBODY, IGG AND IGM
Parvovirus B19 IgG: 0.5 index (ref 0.0–0.8)
Parvovirus B19 IgM: 0.3 index (ref 0.0–0.8)

## 2021-03-16 LAB — RPR: RPR Ser Ql: NONREACTIVE

## 2021-03-16 LAB — VARICELLA ZOSTER ANTIBODY, IGG: Varicella zoster IgG: >4000 index (ref >165)

## 2021-03-16 LAB — HIV ANTIBODY (ROUTINE TESTING W REFLEX): HIV Screen 4th Generation wRfx: NONREACTIVE

## 2021-03-16 NOTE — Telephone Encounter (Signed)
Client has 03/19/2021 [facility] IP appt 03/19/2021. However, kept 03/12/2021 new nurse appt at [facility] with 03/31/2021 appt scheduled with Dr. Logan Bores. Call to client and left message requesting she return call to clarify where plans prenatal care. Number to call rprovided. Jossie Ng, RN

## 2021-03-17 ENCOUNTER — Telehealth: Payer: Self-pay

## 2021-03-17 LAB — GC/CHLAMYDIA PROBE AMP
Chlamydia trachomatis, NAA: NEGATIVE
Neisseria Gonorrhoeae by PCR: NEGATIVE

## 2021-03-17 NOTE — Telephone Encounter (Signed)
TC to patient to see if she plans to continue maternity care at [facility] or if she plans to start care here on 03/19/2021. Patient states she wants to come here at [facility] for maternity care and will keep her 03/19/2021 new OB appointment. Patient counseled to call [facility] to cancel her 03/31/2021 appt., as she does not need maternity care at both sites. Patient states understanding and counseled to arrive at 0800 on 9/1/202.Marland KitchenBurt Knack, RN

## 2021-03-18 NOTE — Progress Notes (Signed)
Phoned pt 03-14-21 to review her medical hx, etc prior to new OB appt here at [facility] 03-19-21.  Pt trying to explain something about going to another doctor. Apparently was seen at [facility] 03-13-21 and has a scheduled appt to see the medical provider there.  Pt stated she wants to receive prenatal care at [facility].    I called Rubin Payor, RN  03-16-21 and she would contact pt regarding where she would be receiving prenatal care.   Completed OB abstraction today  Marchelle Folks

## 2021-03-19 ENCOUNTER — Ambulatory Visit: Payer: Medicaid Other | Admitting: Physician Assistant

## 2021-03-19 ENCOUNTER — Other Ambulatory Visit: Payer: Self-pay

## 2021-03-19 VITALS — BP 111/73 | HR 85 | Temp 97.0°F | Wt 139.0 lb

## 2021-03-19 DIAGNOSIS — Z3401 Encounter for supervision of normal first pregnancy, first trimester: Secondary | ICD-10-CM

## 2021-03-19 DIAGNOSIS — O99011 Anemia complicating pregnancy, first trimester: Secondary | ICD-10-CM

## 2021-03-19 DIAGNOSIS — Z3400 Encounter for supervision of normal first pregnancy, unspecified trimester: Secondary | ICD-10-CM | POA: Insufficient documentation

## 2021-03-19 DIAGNOSIS — O99019 Anemia complicating pregnancy, unspecified trimester: Secondary | ICD-10-CM | POA: Insufficient documentation

## 2021-03-19 HISTORY — DX: Anemia complicating pregnancy, unspecified trimester: O99.019

## 2021-03-19 LAB — URINALYSIS
Bilirubin, UA: NEGATIVE
Glucose, UA: NEGATIVE
Ketones, UA: NEGATIVE
Nitrite, UA: NEGATIVE
Protein,UA: NEGATIVE
RBC, UA: NEGATIVE
Specific Gravity, UA: 1.03 (ref 1.005–1.030)
Urobilinogen, Ur: 0.2 mg/dL (ref 0.2–1.0)
pH, UA: 5.5 (ref 5.0–7.5)

## 2021-03-19 LAB — HEMOGLOBIN, FINGERSTICK: Hemoglobin: 10.5 g/dL — ABNORMAL LOW (ref 11.1–15.9)

## 2021-03-19 NOTE — Progress Notes (Addendum)
Patient here for new OB visit at about 11 1/7. Visit to [facility] urgent care on 02/09/2021 for N/V. Visit to [facility] on 02/11/2021 for evaluation of cramping. U/S on 02/12/2021. Started care (RN visit and labs) at [facility] on 03/13/2021, and plans to continue care here, and to cancel her [facility] visit with MD on 03/31/2021. Wants to deliver at [facility]. Counseled to set up VM. Marland KitchenBurt Knack, RN

## 2021-03-19 NOTE — Progress Notes (Signed)
[facility] HEALTH DEPT [facility] [address] [place] [address] [phone]  INITIAL PRENATAL VISIT NOTE  Subjective:  Claudia Frederick is a 20 y.o. G1P0000 at [redacted]w[redacted]d being seen today to start prenatal care at the [facility].  She is currently monitored for the following issues for this low-risk pregnancy and has Anemia affecting pregnancy and Supervision of normal first pregnancy, antepartum on their problem list.  Patient reports nausea.  Contractions: Not present. Vag. Bleeding: None.  Movement: Absent. Denies leaking of fluid. Gets regular dental care, last visit 2 mo ago.  Indications for ASA therapy (per uptodate) One of the following: Previous pregnancy with preeclampsia, especially early onset and with an adverse outcome No Multifetal gestation No Chronic hypertension No Type 1 or 2 diabetes mellitus No Chronic kidney disease No Autoimmune disease (antiphospholipid syndrome, systemic lupus erythematosus) No  Two or more of the following: Nulliparity Yes Obesity (body mass index >30 kg/m2) No Family history of preeclampsia in mother or sister No Age ?35 years No Sociodemographic characteristics (African American race, low socioeconomic level) Yes Personal risk factors (eg, previous pregnancy with low birth weight or small for gestational age infant, previous adverse pregnancy outcome [eg, stillbirth], interval >10 years between pregnancies) No   The following portions of the patient's history were reviewed and updated as appropriate: allergies, current medications, past family history, past medical history, past social history, past surgical history and problem list. Problem list updated.  Objective:   Vitals:   03/19/21 0832  BP: 111/73  Pulse: 85  Temp: (!) 97 F (36.1 C)  Weight: 139 lb (63 kg)    Fetal Status: Fetal Heart Rate (bpm): 144 Fundal Height: 12 cm Movement: Absent   Presentation: Undeterminable   Physical Exam Vitals and nursing note reviewed.  Constitutional:      General: She is not in acute distress.    Appearance: Normal appearance. She is well-developed.  HENT:     Head: Normocephalic and atraumatic.     Right Ear: External ear normal.     Left Ear: External ear normal.     Nose: Nose normal. No congestion or rhinorrhea.     Mouth/Throat:     Lips: Pink.     Mouth: Mucous membranes are moist.     Dentition: Normal dentition. No dental caries.     Pharynx: Oropharynx is clear. Uvula midline.     Comments: Dentition: teeth in good repair Eyes:     General: No scleral icterus.    Conjunctiva/sclera: Conjunctivae normal.  Neck:     Thyroid: No thyroid mass or thyromegaly.  Cardiovascular:     Rate and Rhythm: Normal rate.     Pulses: Normal pulses.     Comments: Extremities are warm and well perfused Pulmonary:     Effort: Pulmonary effort is normal.     Breath sounds: Normal breath sounds.  Chest:     Chest wall: No mass.  Breasts:    Tanner Score is 5.     Breasts are symmetrical.     Right: Normal. No mass, nipple discharge or skin change.     Left: Normal. No mass, nipple discharge or skin change.  Abdominal:     General: Abdomen is flat.     Palpations: Abdomen is soft.     Tenderness: There is no abdominal tenderness.     Comments: Gravid   Genitourinary:    General: Normal vulva.  Exam position: Lithotomy position.     Pubic Area: No rash.      Labia:        Right: No rash.        Left: No rash.      Vagina: Normal. No vaginal discharge.     Cervix: No cervical motion tenderness, discharge or friability.     Uterus: Normal. Enlarged (Gravid 10-12 week size). Not tender.      Adnexa: Right adnexa normal and left adnexa normal.     Rectum: Normal. No external hemorrhoid.  Musculoskeletal:     Right lower leg: No edema.     Left lower leg: No edema.  Lymphadenopathy:     Cervical: No cervical adenopathy.      Upper Body:     Right upper body: No axillary adenopathy.     Left upper body: No axillary adenopathy.  Skin:    General: Skin is warm.     Capillary Refill: Capillary refill takes less than 2 seconds.  Neurological:     Mental Status: She is alert.    Assessment and Plan:  Pregnancy: G1P0000 at [redacted]w[redacted]d  1. Supervision of normal first pregnancy, antepartum Pt declines first trim screen today. Plans to cancel previously sched prenatal appt at [facility], as she plans to continue prenatal care here and deliver at [facility]. Provided general overview of prenatal care plan. Consider starting low-dose aspirin after 12 wk. States h/o migraines, with last about 2 years ago. Counseled to take Tylenol at first sign of migraine during pregnancy. - Hgb Fractionation Cascade - QuantiFERON-TB Gold Plus - CBC with Differential/Platelet - Hemoglobin, fingerstick - Urinalysis - Fe+CBC/D/Plt+TIBC+Fer+Retic  2. Anemia affecting pregnancy in first trimester Treat per standing orders.    Discussed overview of care and coordination with inpatient delivery practices including [facility], [facility], [facility] and [facility].    Preterm labor symptoms and general obstetric precautions including but not limited to vaginal bleeding, contractions, leaking of fluid and fetal movement were reviewed in detail with the patient.  Please refer to After Visit Summary for other counseling recommendations.   Return in about 4 weeks (around 04/16/2021) for Routine prenatal care.  Future Appointments  Date Time Provider Department Center  03/31/2021 10:00 AM Linzie Collin, MD [facility] None  04/16/2021 10:20 AM [facility] PROVIDER [facility] None    Landry Dyke, PA-C

## 2021-03-19 NOTE — Progress Notes (Addendum)
Urine dip and Hgb reviewed in clinic. Patient Hgb=10.5 today, anemia panel ordered, FeSO4 initiated per SO, anemia pamphlet given and patient counseled on iron rich foods. Patient also counseled to go to [facility] today or any other day. Marland KitchenBurt Knack, RN

## 2021-03-20 LAB — FE+CBC/D/PLT+TIBC+FER+RETIC
Basophils Absolute: 0 10*3/uL (ref 0.0–0.2)
Basos: 0 %
EOS (ABSOLUTE): 0.1 10*3/uL (ref 0.0–0.4)
Eos: 1 %
Ferritin: 8 ng/mL — ABNORMAL LOW (ref 15–150)
Hematocrit: 34.2 % (ref 34.0–46.6)
Hemoglobin: 10.6 g/dL — ABNORMAL LOW (ref 11.1–15.9)
Immature Grans (Abs): 0 10*3/uL (ref 0.0–0.1)
Immature Granulocytes: 0 %
Iron Saturation: 6 % — CL (ref 15–55)
Iron: 24 ug/dL — ABNORMAL LOW (ref 27–159)
Lymphocytes Absolute: 1.5 10*3/uL (ref 0.7–3.1)
Lymphs: 21 %
MCH: 26.6 pg (ref 26.6–33.0)
MCHC: 31 g/dL — ABNORMAL LOW (ref 31.5–35.7)
MCV: 86 fL (ref 79–97)
Monocytes Absolute: 0.5 10*3/uL (ref 0.1–0.9)
Monocytes: 7 %
Neutrophils Absolute: 5.1 10*3/uL (ref 1.4–7.0)
Neutrophils: 71 %
Platelets: 268 10*3/uL (ref 150–450)
RBC: 3.99 x10E6/uL (ref 3.77–5.28)
RDW: 14.1 % (ref 11.7–15.4)
Retic Ct Pct: 1.3 % (ref 0.6–2.6)
Total Iron Binding Capacity: 408 ug/dL (ref 250–450)
UIBC: 384 ug/dL (ref 131–425)
WBC: 7.1 10*3/uL (ref 3.4–10.8)

## 2021-03-20 MED ORDER — IRON (FERROUS SULFATE) 325 (65 FE) MG PO TABS: 1.0000 | ORAL_TABLET | Freq: Two times a day (BID) | ORAL | 0 refills | Status: DC

## 2021-03-20 NOTE — Addendum Note (Signed)
Addended by: Burt Knack on: 03/20/2021 08:09 AM   Modules accepted: Orders

## 2021-03-24 LAB — QUANTIFERON-TB GOLD PLUS
QuantiFERON Mitogen Value: 7.11 IU/mL
QuantiFERON Nil Value: 0.03 IU/mL
QuantiFERON TB1 Ag Value: 0.05 IU/mL
QuantiFERON TB2 Ag Value: 0.03 IU/mL
QuantiFERON-TB Gold Plus: NEGATIVE

## 2021-03-24 LAB — HGB FRACTIONATION CASCADE
Hgb A2: 2.3 % (ref 1.8–3.2)
Hgb A: 97.7 % (ref 96.4–98.8)
Hgb F: 0 % (ref 0.0–2.0)
Hgb S: 0 %

## 2021-03-31 ENCOUNTER — Encounter: Payer: Medicaid Other | Admitting: Obstetrics and Gynecology

## 2021-03-31 DIAGNOSIS — Z3A12 12 weeks gestation of pregnancy: Secondary | ICD-10-CM

## 2021-03-31 DIAGNOSIS — Z3401 Encounter for supervision of normal first pregnancy, first trimester: Secondary | ICD-10-CM

## 2021-04-16 ENCOUNTER — Other Ambulatory Visit: Payer: Self-pay

## 2021-04-16 ENCOUNTER — Encounter: Payer: Self-pay | Admitting: Physician Assistant

## 2021-04-16 ENCOUNTER — Ambulatory Visit: Payer: Medicaid Other | Admitting: Physician Assistant

## 2021-04-16 VITALS — BP 117/66 | HR 86 | Temp 97.2°F | Wt 139.8 lb

## 2021-04-16 DIAGNOSIS — Z3400 Encounter for supervision of normal first pregnancy, unspecified trimester: Secondary | ICD-10-CM

## 2021-04-16 DIAGNOSIS — O99019 Anemia complicating pregnancy, unspecified trimester: Secondary | ICD-10-CM

## 2021-04-16 LAB — HEMOGLOBIN, FINGERSTICK: Hemoglobin: 10.3 g/dL — ABNORMAL LOW (ref 11.1–15.9)

## 2021-04-16 NOTE — Progress Notes (Signed)
[facility] Health Department Maternal Health Clinic  PRENATAL VISIT NOTE  Subjective:  Claudia Frederick is a 20 y.o. G1P0000 at [redacted]w[redacted]d being seen today for ongoing prenatal care.  She is currently monitored for the following issues for this low-risk pregnancy and has Anemia affecting pregnancy and Supervision of normal first pregnancy, antepartum on their problem list.  Patient reports nausea.  Contractions: Not present. Vag. Bleeding: None.  Movement: Absent. Denies leaking of fluid/ROM.   The following portions of the patient's history were reviewed and updated as appropriate: allergies, current medications, past family history, past medical history, past social history, past surgical history and problem list. Problem list updated.  Objective:   Vitals:   04/16/21 1039  BP: 117/66  Pulse: 86  Temp: (!) 97.2 F (36.2 C)  Weight: 139 lb 12.8 oz (63.4 kg)    Fetal Status: Fetal Heart Rate (bpm): 148 Fundal Height: 15 cm Movement: Absent     General:  Alert, oriented and cooperative. Patient is in no acute distress.  Skin: Skin is warm and dry. No rash noted.   Cardiovascular: Normal heart rate noted  Respiratory: Normal respiratory effort, no problems with respiration noted  Abdomen: Soft, gravid, appropriate for gestational age.  Pain/Pressure: Absent     Pelvic: Cervical exam deferred        Extremities: Normal range of motion.  Edema: None  Mental Status: Normal mood and affect. Normal behavior. Normal judgment and thought content.   Assessment and Plan:  Pregnancy: G1P0000 at [redacted]w[redacted]d  1. Supervision of normal first pregnancy, antepartum Still having some nausea, but good weight gain. Has not started low-dose aspirin. Counseled re: risks/benefits and pt agrees to start. Written info given. Desires quad screen today. Sched fetal anat [place] for 19-20 wk. - Hemoglobin, fingerstick - AFP TETRA  2. Anemia affecting pregnancy, antepartum Taking oral iron as instructed.  Check hgb today.   Preterm labor symptoms and general obstetric precautions including but not limited to vaginal bleeding, contractions, leaking of fluid and fetal movement were reviewed in detail with the patient. Please refer to After Visit Summary for other counseling recommendations.  Return in about 4 weeks (around 05/14/2021) for Routine prenatal care.  Future Appointments  Date Time Provider Department Center  05/14/2021 10:20 AM [facility]-MH PROVIDER AC-MAT None    Landry Dyke, PA-C

## 2021-04-16 NOTE — Progress Notes (Signed)
Hgb = 10.3 and to continue iron daily. [facility] [place] referral faxed with snapshot pages and fax confirmation received. Client aware [facility] will only make 3 attempts to contact her with appt via phone call. Client phone number verified. Jossie Ng, RN

## 2021-04-16 NOTE — Progress Notes (Signed)
Patient here for MH RV at 15 1/7. Taking iron once daily with juice. Needs Hgb checked today. Desires Quad screen. Needs to get her mom to the doctor at 11:30.Burt Knack, RN

## 2021-04-18 LAB — AFP TETRA
DIA Mom Value: 1.56
DIA Value (EIA): 278.44 pg/mL
DSR (By Age)    1 IN: 1143
DSR (Second Trimester) 1 IN: 3104
Gestational Age: 15.1 WEEKS
MSAFP Mom: 1.27
MSAFP: 41.4 ng/mL
MSHCG Mom: 1.75
MSHCG: 107873 m[IU]/mL
Maternal Age At EDD: 21.3 yr
Osb Risk: 5251
T18 (By Age): 1:4452 {titer}
Test Results:: NEGATIVE
Weight: 140 [lb_av]
uE3 Mom: 0.92
uE3 Value: 0.75 ng/mL

## 2021-04-29 NOTE — Addendum Note (Signed)
Addended by: Geanie Berlin on: 04/29/2021 09:11 AM   Modules accepted: Orders

## 2021-05-14 ENCOUNTER — Ambulatory Visit: Payer: Medicaid Other

## 2021-05-15 ENCOUNTER — Telehealth: Payer: Self-pay

## 2021-05-15 ENCOUNTER — Ambulatory Visit: Payer: Medicaid Other

## 2021-05-15 ENCOUNTER — Ambulatory Visit: Payer: Medicaid Other | Admitting: Advanced Practice Midwife

## 2021-05-15 ENCOUNTER — Other Ambulatory Visit: Payer: Self-pay

## 2021-05-15 VITALS — BP 109/63 | HR 84 | Temp 97.0°F | Wt 143.6 lb

## 2021-05-15 DIAGNOSIS — Z3400 Encounter for supervision of normal first pregnancy, unspecified trimester: Secondary | ICD-10-CM

## 2021-05-15 DIAGNOSIS — O99019 Anemia complicating pregnancy, unspecified trimester: Secondary | ICD-10-CM

## 2021-05-15 DIAGNOSIS — O99012 Anemia complicating pregnancy, second trimester: Secondary | ICD-10-CM

## 2021-05-15 DIAGNOSIS — Z3402 Encounter for supervision of normal first pregnancy, second trimester: Secondary | ICD-10-CM

## 2021-05-15 LAB — HEMOGLOBIN, FINGERSTICK: Hemoglobin: 9.6 g/dL — ABNORMAL LOW (ref 11.1–15.9)

## 2021-05-15 NOTE — Progress Notes (Signed)
[facility] Health Department Maternal Health Clinic  PRENATAL VISIT NOTE  Subjective:  Neve Branscomb is a 20 y.o. G1P0000 at [redacted]w[redacted]d being seen today for ongoing prenatal care.  She is currently monitored for the following issues for this low-risk pregnancy and has Anemia affecting pregnancy and Supervision of normal first pregnancy, antepartum on their problem list.  Patient reports fatigue.  Contractions: Not present. Vag. Bleeding: None.  Movement: Absent. Denies leaking of fluid/ROM.   The following portions of the patient's history were reviewed and updated as appropriate: allergies, current medications, past family history, past medical history, past social history, past surgical history and problem list. Problem list updated.  Objective:   Vitals:   05/15/21 1058  BP: 109/63  Pulse: 84  Temp: (!) 97 F (36.1 C)  Weight: 143 lb 9.6 oz (65.1 kg)    Fetal Status: Fetal Heart Rate (bpm): 130 Fundal Height: 20 cm Movement: Absent     General:  Alert, oriented and cooperative. Patient is in no acute distress.  Skin: Skin is warm and dry. No rash noted.   Cardiovascular: Normal heart rate noted  Respiratory: Normal respiratory effort, no problems with respiration noted  Abdomen: Soft, gravid, appropriate for gestational age.  Pain/Pressure: Absent     Pelvic: Cervical exam deferred        Extremities: Normal range of motion.  Edema: None  Mental Status: Normal mood and affect. Normal behavior. Normal judgment and thought content.   Assessment and Plan:  Pregnancy: G1P0000 at [redacted]w[redacted]d  1. Supervision of normal first pregnancy, antepartum Quad screen neg 04/16/21 Living with her parents and 2 brothers, 1 sister Working 40 hrs/wk and not in school Not taking ASA daily Not exercising "I don't like to" 9 lb 9.6 oz (4.355 kg) Gained 4 lbs in last 4 wks; counseled on 15-25 lb wt gain this pregnancy Anatomy u/s 05/20/21 - Hemoglobin, fingerstick  2. Anemia affecting  pregnancy, antepartum Not taking FeSo4 and c/o fatigue 04/16/21 Hgb 10.3; today Hgb=9.6; counseled needs to take FeSo4 BID with oj; Stopped eating ice  - Hemoglobin, fingerstick   Preterm labor symptoms and general obstetric precautions including but not limited to vaginal bleeding, contractions, leaking of fluid and fetal movement were reviewed in detail with the patient. Please refer to After Visit Summary for other counseling recommendations.  Return in about 4 weeks (around 06/12/2021) for routine PNC.  No future appointments.  Alberteen Spindle, CNM

## 2021-05-15 NOTE — Progress Notes (Signed)
Patient here for MH RV at 19 2/7. Needs Hgb check today, not taking iron. Aware of [facility] U/S on 05/20/21.Burt Knack, RN

## 2021-05-15 NOTE — Telephone Encounter (Signed)
TC to patient to reschedule her appt due to no provider in clinic this morning at the time of her appt. Patient states she can't come next week, and this is her day off. Patient states she really wants to come today and is willing to be worked in. Patient offered an appt this afternoon to be here at 1:45 and patient states she wants to come at 12:00. Patient informed there are no appts at that time as clinic is closed for lunch. Patient states she wants to come at 11:00. Patient scheduled for 11:00 as an overbook, and counseled to arrive at 10:45, and that she will be a work in.Burt Knack, RN

## 2021-06-04 ENCOUNTER — Encounter: Payer: Self-pay | Admitting: Physician Assistant

## 2021-06-04 NOTE — Progress Notes (Signed)
Reviewed 05/20/21 [facility] Anat [place] @ [redacted]w[redacted]d. EDC 10/03/21 c/w 10/07/21 [facility] EDC, anat WNL, 3vc, plac ant/high.

## 2021-06-09 ENCOUNTER — Ambulatory Visit: Payer: Medicaid Other | Admitting: Family Medicine

## 2021-06-09 ENCOUNTER — Other Ambulatory Visit: Payer: Self-pay

## 2021-06-09 ENCOUNTER — Encounter: Payer: Self-pay | Admitting: Family Medicine

## 2021-06-09 VITALS — BP 103/64 | HR 86 | Temp 97.6°F | Wt 149.4 lb

## 2021-06-09 DIAGNOSIS — L2489 Irritant contact dermatitis due to other agents: Secondary | ICD-10-CM

## 2021-06-09 DIAGNOSIS — O99012 Anemia complicating pregnancy, second trimester: Secondary | ICD-10-CM

## 2021-06-09 DIAGNOSIS — Z3400 Encounter for supervision of normal first pregnancy, unspecified trimester: Secondary | ICD-10-CM

## 2021-06-09 LAB — HEMOGLOBIN, FINGERSTICK: Hemoglobin: 9.5 g/dL — ABNORMAL LOW (ref 11.1–15.9)

## 2021-06-09 MED ORDER — LORATADINE 10 MG PO TABS: 10.0000 mg | ORAL_TABLET | Freq: Every day | ORAL | 2 refills | Status: DC

## 2021-06-09 MED ORDER — HYDROCORTISONE 1 % EX CREA: 1.0000 "application " | TOPICAL_CREAM | Freq: Two times a day (BID) | CUTANEOUS | 0 refills | Status: DC

## 2021-06-09 NOTE — Patient Instructions (Signed)
Use loratadine (generic for Claritin) - can be found at any pharmacy, take daily  Use sensitive soap  Pat area dry or use cool setting on hair dryer  Use Dove Advanced Care Sensitive Antiperspirant Deodorant while healing  Can use hydrocortisone 1% topical cream  Recheck in 2 weeks

## 2021-06-09 NOTE — Progress Notes (Signed)
Correctly verbalizes how to take iron and PNV. Taking iron with juice. Hgb today. Requests flu vaccine at 06/2021 [facility] RV. Jossie Ng, RN Hgb = 9.5. Counseled to increase iron to TID. Per client, has difficulty swallowing pills and has been crushing iron tablets and mixing with orange juice. Client counseled to mix tablet with pudding, mashed potatos, applesauce or soft foods. Jossie Ng, RN

## 2021-06-09 NOTE — Progress Notes (Signed)
[facility] Health Department Maternal Health Clinic  PRENATAL VISIT NOTE  Subjective:  Claudia Frederick is a 20 y.o. G1P0000 at [redacted]w[redacted]d being seen today for ongoing prenatal care.  She is currently monitored for the following issues for this low-risk pregnancy and has Anemia affecting pregnancy and Supervision of normal first pregnancy, antepartum on their problem list.  Patient reports  rash under arm pits .  Contractions: Not present. Vag. Bleeding: None.  Movement: Present. Denies leaking of fluid/ROM.   The following portions of the patient's history were reviewed and updated as appropriate: allergies, current medications, past family history, past medical history, past social history, past surgical history and problem list. Problem list updated.  Objective:   Vitals:   06/09/21 0902  BP: 103/64  Pulse: 86  Temp: 97.6 F (36.4 C)  Weight: 149 lb 6.4 oz (67.8 kg)    Fetal Status: Fetal Heart Rate (bpm): 145 Fundal Height: 22 cm Movement: Present     General:  Alert, oriented and cooperative. Patient is in no acute distress.  Skin: Skin is warm and dry. No rash noted.   Cardiovascular: Normal heart rate noted  Respiratory: Normal respiratory effort, no problems with respiration noted  Abdomen: Soft, gravid, appropriate for gestational age.  Pain/Pressure: Absent     Pelvic: Cervical exam deferred        Extremities: Normal range of motion.  Edema: None  Mental Status: Normal mood and affect. Normal behavior. Normal judgment and thought content.   Assessment and Plan:  Pregnancy: G1P0000 at [redacted]w[redacted]d   1. Anemia during pregnancy in second trimester Hgb today is 9.5  pt reports that she was crushing FeSo4 and putting in juice.  Discussed with patient importance of taking iron whole and methods to help.    - Hemoglobin, venipuncture  2. Supervision of normal first pregnancy, antepartum - taking gummy PNV Discussed [facility] U/S - WNL  Pt had 6 lb weight gain since last  visit.  Pt reports eating more often. Encouraged increase protein and less carbs and exercise.   -Monitor weight   3. Irritant contact dermatitis due to other agents Pt reports using a different soap and deodorant. Pt has hyperpigmented armpits and macular rash.   Suggested to change to unscented sensitive soap such as dove .   Pt was using axe deodorant, suggested to use dove ,  To use 1 % hydrocortisone cream when itching and allergy medication to help reduce, irritation.    Pt to RTC if no improvements in 2 weeks.   - loratadine (ALLERGY RELIEF LORATADINE) 10 MG tablet; Take 1 tablet (10 mg total) by mouth daily.  Dispense: 30 tablet; Refill: 2 - hydrocortisone cream 1 %; Apply 1 application topically 2 (two) times daily.  Dispense: 30 g; Refill: 0   Preterm labor symptoms and general obstetric precautions including but not limited to vaginal bleeding, contractions, leaking of fluid and fetal movement were reviewed in detail with the patient. Please refer to After Visit Summary for other counseling recommendations.  Return in about 2 weeks (around 06/23/2021) for recheck rash under arm pits .  Future Appointments  Date Time Provider Department Center  06/23/2021 10:00 AM [facility] PROVIDER AC-MAT None    Wendi Snipes, FNP

## 2021-06-16 ENCOUNTER — Telehealth: Payer: Self-pay | Admitting: Advanced Practice Midwife

## 2021-06-16 NOTE — Telephone Encounter (Signed)
T/C transferred from front desk at 0900. Spoke with pt who states she is 24 wks and hasn't felt her baby move for the last 2-3 days. Has eaten a good protein breakfast and still no fetal movement while at work. Counseled pt to go to [facility] L&D for evaluation. Pt agrees

## 2021-06-23 ENCOUNTER — Ambulatory Visit: Payer: Medicaid Other | Admitting: Advanced Practice Midwife

## 2021-06-23 ENCOUNTER — Other Ambulatory Visit: Payer: Self-pay

## 2021-06-23 VITALS — BP 95/60 | HR 92 | Temp 97.5°F | Wt 154.6 lb

## 2021-06-23 DIAGNOSIS — Z3400 Encounter for supervision of normal first pregnancy, unspecified trimester: Secondary | ICD-10-CM

## 2021-06-23 DIAGNOSIS — O99019 Anemia complicating pregnancy, unspecified trimester: Secondary | ICD-10-CM

## 2021-06-23 NOTE — Progress Notes (Signed)
Patient here for MH RV   Patient declined flu vaccine today stating "not today maybe next visit".   Reports taking iron TID and that she cuts pills in half and then takes it together, no longer crushing pills. Reports to taking gummy PNV daily.   Floy Sabina, RN

## 2021-06-23 NOTE — Progress Notes (Signed)
[facility] Health Department Maternal Health Clinic  PRENATAL VISIT NOTE  Subjective:  Claudia Frederick is a 20 y.o. G1P0000 at [redacted]w[redacted]d being seen today for ongoing prenatal care.  She is currently monitored for the following issues for this low-risk pregnancy and has Anemia affecting pregnancy and Supervision of normal first pregnancy, antepartum on their problem list.  Patient reports no complaints.  Contractions: Not present. Vag. Bleeding: None.  Movement: Present. Denies leaking of fluid/ROM.   The following portions of the patient's history were reviewed and updated as appropriate: allergies, current medications, past family history, past medical history, past social history, past surgical history and problem list. Problem list updated.  Objective:   Vitals:   06/23/21 1008  BP: 95/60  Pulse: 92  Temp: (!) 97.5 F (36.4 C)  Weight: 154 lb 9.6 oz (70.1 kg)    Fetal Status: Fetal Heart Rate (bpm): 150 Fundal Height: 25 cm Movement: Present     General:  Alert, oriented and cooperative. Patient is in no acute distress.  Skin: Skin is warm and dry. No rash noted.   Cardiovascular: Normal heart rate noted  Respiratory: Normal respiratory effort, no problems with respiration noted  Abdomen: Soft, gravid, appropriate for gestational age.  Pain/Pressure: Absent     Pelvic: Cervical exam deferred        Extremities: Normal range of motion.  Edema: None  Mental Status: Normal mood and affect. Normal behavior. Normal judgment and thought content.   Assessment and Plan:  Pregnancy: G1P0000 at [redacted]w[redacted]d  1. Supervision of normal first pregnancy, antepartum Needs pap at next apt 20 lb 9.6 oz (9.344 kg) Reviewed 05/20/21 u/s at 20 4/7 with 3VC, AFI wnl, EFW=30%, anterior placenta with normal anatomy Walking 4x/wk x 30-40 min Went to [facility] 06/16/21 for decreased fetal movement Working 40 hrs/wk at [employer] and living with her parents, sister, 2 brothers  2. Anemia affecting  pregnancy, antepartum Taking FeSo4 BID with oj--to take TID   Preterm labor symptoms and general obstetric precautions including but not limited to vaginal bleeding, contractions, leaking of fluid and fetal movement were reviewed in detail with the patient. Please refer to After Visit Summary for other counseling recommendations.  No follow-ups on file.  No future appointments.  Alberteen Spindle, CNM

## 2021-06-30 ENCOUNTER — Telehealth: Payer: Self-pay

## 2021-06-30 ENCOUNTER — Other Ambulatory Visit: Payer: Self-pay

## 2021-06-30 ENCOUNTER — Ambulatory Visit: Payer: Medicaid Other | Admitting: Family Medicine

## 2021-06-30 VITALS — BP 123/80 | HR 78 | Temp 97.1°F | Wt 156.6 lb

## 2021-06-30 DIAGNOSIS — Z3400 Encounter for supervision of normal first pregnancy, unspecified trimester: Secondary | ICD-10-CM

## 2021-06-30 DIAGNOSIS — O99012 Anemia complicating pregnancy, second trimester: Secondary | ICD-10-CM

## 2021-06-30 DIAGNOSIS — O368120 Decreased fetal movements, second trimester, not applicable or unspecified: Secondary | ICD-10-CM

## 2021-06-30 NOTE — Progress Notes (Signed)
Patient here for OB problem, states her baby is not moving as much as normal since yesterday. She is 25 6/7 today.Burt Knack, RN

## 2021-06-30 NOTE — Telephone Encounter (Signed)
Client called clinic directly to report decreased fetal movement x36 hours. Per Dr. Alvester Morin, may schedule in [facility] this pm. Client desires to come at 1600 today. Appt scheduled. Jossie Ng, RN

## 2021-07-16 NOTE — Progress Notes (Signed)
[facility] Health Department Maternal Health Clinic  PRENATAL VISIT NOTE  Subjective:  Claudia Frederick is a 20 y.o. G1P0000 at 66 wk6d being seen today for ongoing prenatal care.  She is currently monitored for the following issues for this low-risk pregnancy and has Anemia affecting pregnancy and Supervision of normal first pregnancy, antepartum on their problem list.  Patient reports  not feeling baby as much .  Contractions: Not present. Vag. Bleeding: None.  Movement: (!) Decreased. Denies leaking of fluid/ROM.   The following portions of the patient's history were reviewed and updated as appropriate: allergies, current medications, past family history, past medical history, past social history, past surgical history and problem list. Problem list updated.  Objective:   Vitals:   06/30/21 1628  BP: 123/80  Pulse: 78  Temp: (!) 97.1 F (36.2 C)  Weight: 156 lb 9.6 oz (71 kg)    Fetal Status: Fetal Heart Rate (bpm): 140 Fundal Height: 27 cm Movement: (!) Decreased     General:  Alert, oriented and cooperative. Patient is in no acute distress.  Skin: Skin is warm and dry. No rash noted.   Cardiovascular: Normal heart rate noted  Respiratory: Normal respiratory effort, no problems with respiration noted  Abdomen: Soft, gravid, appropriate for gestational age.  Pain/Pressure: Absent     Pelvic: Cervical exam deferred        Extremities: Normal range of motion.  Edema: None  Mental Status: Normal mood and affect. Normal behavior. Normal judgment and thought content.   Assessment and Plan:  Pregnancy: G1P0000 at [redacted]w[redacted]d  1. Decreased fetal movement affecting management of pregnancy in second trimester, single or unspecified fetus FHR was WNL Reported movement during visit NST not useful at 25 wks Discussed going to hospital if persistent  2. Supervision of normal first pregnancy, antepartum Up to date Next visit will have 28 wk labs and discussed process with  patient  3. Anemia affecting pregnancy in second trimester Hgb at next visit.    Preterm labor symptoms and general obstetric precautions including but not limited to vaginal bleeding, contractions, leaking of fluid and fetal movement were reviewed in detail with the patient. Please refer to After Visit Summary for other counseling recommendations.  Return in about 17 days (around 07/17/2021) for Scheduled prenatal.  Future Appointments  Date Time Provider Department Center  07/17/2021  8:20 AM [facility]-MH PROVIDER AC-MAT None    Federico Flake, MD

## 2021-07-17 ENCOUNTER — Ambulatory Visit: Payer: Medicaid Other | Admitting: Physician Assistant

## 2021-07-17 ENCOUNTER — Encounter: Payer: Self-pay | Admitting: Physician Assistant

## 2021-07-17 ENCOUNTER — Other Ambulatory Visit: Payer: Self-pay

## 2021-07-17 VITALS — BP 111/70 | HR 87 | Temp 97.3°F | Wt 162.2 lb

## 2021-07-17 DIAGNOSIS — Z3400 Encounter for supervision of normal first pregnancy, unspecified trimester: Secondary | ICD-10-CM

## 2021-07-17 DIAGNOSIS — Z23 Encounter for immunization: Secondary | ICD-10-CM

## 2021-07-17 DIAGNOSIS — Z3403 Encounter for supervision of normal first pregnancy, third trimester: Secondary | ICD-10-CM

## 2021-07-17 DIAGNOSIS — O99013 Anemia complicating pregnancy, third trimester: Secondary | ICD-10-CM

## 2021-07-17 DIAGNOSIS — O99019 Anemia complicating pregnancy, unspecified trimester: Secondary | ICD-10-CM

## 2021-07-17 LAB — HEMOGLOBIN, FINGERSTICK: Hemoglobin: 10.2 g/dL — ABNORMAL LOW (ref 11.1–15.9)

## 2021-07-17 NOTE — Progress Notes (Signed)
Patient here for MH RV at 28 2/7. 28 week labs today, needs to be to lab by 9:15 for 9:24 blood draw. Burt Knack, RN

## 2021-07-17 NOTE — Addendum Note (Signed)
Addended by: Landry Dyke on: 07/17/2021 10:34 AM   Modules accepted: Orders

## 2021-07-17 NOTE — Progress Notes (Addendum)
Tdap given, tolerated well, VIS given. 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 Hgb reviewed and patient counseled to continue taking her iron tablets.Burt Knack, RN

## 2021-07-17 NOTE — Progress Notes (Signed)
[facility] Health Department Maternal Health Clinic  PRENATAL VISIT NOTE  Subjective:  Claudia Frederick is a 20 y.o. G1P0000 at [redacted]w[redacted]d being seen today for ongoing prenatal care.  She is currently monitored for the following issues for this low-risk pregnancy and has Anemia affecting pregnancy and Supervision of normal first pregnancy, antepartum on their problem list.  Patient reports no complaints.  Contractions: Not present. Vag. Bleeding: None.  Movement: Present. Denies leaking of fluid/ROM.   The following portions of the patient's history were reviewed and updated as appropriate: allergies, current medications, past family history, past medical history, past social history, past surgical history and problem list. Problem list updated.  Objective:   Vitals:   07/17/21 0819  BP: 111/70  Pulse: 87  Temp: (!) 97.3 F (36.3 C)  Weight: 162 lb 3.2 oz (73.6 kg)    Fetal Status: Fetal Heart Rate (bpm): 136 Fundal Height: 29 cm Movement: Present     General:  Alert, oriented and cooperative. Patient is in no acute distress.  Skin: Skin is warm and dry. No rash noted.   Cardiovascular: Normal heart rate noted  Respiratory: Normal respiratory effort, no problems with respiration noted  Abdomen: Soft, gravid, appropriate for gestational age.  Pain/Pressure: Absent     Pelvic: Cervical exam deferred        Extremities: Normal range of motion.  Edema: None  Mental Status: Normal mood and affect. Normal behavior. Normal judgment and thought content.   Assessment and Plan:  Pregnancy: G1P0000 at [redacted]w[redacted]d  1. Supervision of normal first pregnancy, antepartum Doing well. Routine 28-wk labs today. Anticipatory guidance re: transition to every 2-wk visits. - HIV-1/HIV-2 Qualitative RNA - RPR - Glucose tolerance, 1 hour  2. Anemia affecting pregnancy, antepartum Taking oral iron. Recheck hgb today. - Hemoglobin, fingerstick   Preterm labor symptoms and general obstetric  precautions including but not limited to vaginal bleeding, contractions, leaking of fluid and fetal movement were reviewed in detail with the patient. Please refer to After Visit Summary for other counseling recommendations.  Return in about 2 weeks (around 07/31/2021) for Routine prenatal care.  No future appointments.  Landry Dyke, PA-C

## 2021-07-18 LAB — FE+CBC/D/PLT+TIBC+FER+RETIC
Basophils Absolute: 0 10*3/uL (ref 0.0–0.2)
Basos: 1 %
EOS (ABSOLUTE): 0.1 10*3/uL (ref 0.0–0.4)
Eos: 1 %
Ferritin: 10 ng/mL — ABNORMAL LOW (ref 15–150)
Hematocrit: 30.4 % — ABNORMAL LOW (ref 34.0–46.6)
Hemoglobin: 9.9 g/dL — ABNORMAL LOW (ref 11.1–15.9)
Immature Grans (Abs): 0 10*3/uL (ref 0.0–0.1)
Immature Granulocytes: 1 %
Iron Saturation: 27 % (ref 15–55)
Iron: 114 ug/dL (ref 27–159)
Lymphocytes Absolute: 1.1 10*3/uL (ref 0.7–3.1)
Lymphs: 15 %
MCH: 26.8 pg (ref 26.6–33.0)
MCHC: 32.6 g/dL (ref 31.5–35.7)
MCV: 82 fL (ref 79–97)
Monocytes Absolute: 0.6 10*3/uL (ref 0.1–0.9)
Monocytes: 8 %
Neutrophils Absolute: 5.6 10*3/uL (ref 1.4–7.0)
Neutrophils: 74 %
Platelets: 271 10*3/uL (ref 150–450)
RBC: 3.69 x10E6/uL — ABNORMAL LOW (ref 3.77–5.28)
RDW: 17 % — ABNORMAL HIGH (ref 11.7–15.4)
Retic Ct Pct: 2.1 % (ref 0.6–2.6)
Total Iron Binding Capacity: 416 ug/dL (ref 250–450)
UIBC: 302 ug/dL (ref 131–425)
WBC: 7.6 10*3/uL (ref 3.4–10.8)

## 2021-07-18 LAB — RPR: RPR Ser Ql: NONREACTIVE

## 2021-07-18 LAB — GLUCOSE, 1 HOUR GESTATIONAL: Gestational Diabetes Screen: 73 mg/dL (ref 70–139)

## 2021-07-18 LAB — HIV-1/HIV-2 QUALITATIVE RNA
HIV-1 RNA, Qualitative: NONREACTIVE
HIV-2 RNA, Qualitative: NONREACTIVE

## 2021-07-23 NOTE — Addendum Note (Signed)
Addended by: Heywood Bene on: 07/23/2021 01:41 PM   Modules accepted: Orders

## 2021-07-24 ENCOUNTER — Telehealth: Payer: Self-pay | Admitting: Family Medicine

## 2021-07-24 NOTE — Telephone Encounter (Signed)
Pt is feeling lower abdomen pressure, cramps and back pain. She is concerned and would like to speak to a nurse.  She is at work and can't always answer her phone but she has a break at 12:45 and gets off at 4:00 so if you can call her at one of those times, that would be great.

## 2021-07-24 NOTE — Telephone Encounter (Signed)
Shortly after clerk put in phone note, client returned call and Santiago Glad transferred call to [facility]. Per client, she began having vaginal pressure every 30 - 60 minutes ~ 2 days ago. Last night began having small amount of clear watery vaginal discharge that has increased this am. Also with c/o new onset of abdominal cramping and back pain. Reports decreased fetal movement since yesterday. Per consult with E. Sciora CNM, client to seek L & D evaluation now (EGA = 29). Client is agreeable with plan. Rich Number, RN

## 2021-07-31 ENCOUNTER — Other Ambulatory Visit: Payer: Self-pay

## 2021-07-31 ENCOUNTER — Ambulatory Visit: Payer: Medicaid Other | Admitting: Advanced Practice Midwife

## 2021-07-31 VITALS — BP 108/70 | HR 94 | Temp 97.2°F | Wt 162.4 lb

## 2021-07-31 DIAGNOSIS — Z3400 Encounter for supervision of normal first pregnancy, unspecified trimester: Secondary | ICD-10-CM

## 2021-07-31 DIAGNOSIS — O2340 Unspecified infection of urinary tract in pregnancy, unspecified trimester: Secondary | ICD-10-CM | POA: Insufficient documentation

## 2021-07-31 LAB — URINALYSIS
Bilirubin, UA: NEGATIVE
Glucose, UA: NEGATIVE
Ketones, UA: NEGATIVE
Leukocytes,UA: NEGATIVE
Nitrite, UA: NEGATIVE
Protein,UA: NEGATIVE
RBC, UA: NEGATIVE
Specific Gravity, UA: 1.02 (ref 1.005–1.030)
Urobilinogen, Ur: 1 mg/dL (ref 0.2–1.0)
pH, UA: 8.5 — ABNORMAL HIGH (ref 5.0–7.5)

## 2021-07-31 NOTE — Progress Notes (Signed)
[facility] Health Department Maternal Health Clinic  PRENATAL VISIT NOTE  Subjective:  Claudia Frederick is a 21 y.o. G1P0000 at [redacted]w[redacted]d being seen today for ongoing prenatal care.  She is currently monitored for the following issues for this low-risk pregnancy and has Anemia affecting pregnancy; Supervision of normal first pregnancy, antepartum; and UTI (urinary tract infection) during pregnancy dx'd [facility] 07/24/21 on their problem list.  Patient reports no complaints.  Contractions: Not present. Vag. Bleeding: None.  Movement: Present. Denies leaking of fluid/ROM.   The following portions of the patient's history were reviewed and updated as appropriate: allergies, current medications, past family history, past medical history, past social history, past surgical history and problem list. Problem list updated.  Objective:   Vitals:   07/31/21 0925  BP: 108/70  Pulse: 94  Temp: (!) 97.2 F (36.2 C)  Weight: 162 lb 6.4 oz (73.7 kg)    Fetal Status: Fetal Heart Rate (bpm): 140 Fundal Height: 30 cm Movement: Present     General:  Alert, oriented and cooperative. Patient is in no acute distress.  Skin: Skin is warm and dry. No rash noted.   Cardiovascular: Normal heart rate noted  Respiratory: Normal respiratory effort, no problems with respiration noted  Abdomen: Soft, gravid, appropriate for gestational age.  Pain/Pressure: Absent     Pelvic: Cervical exam deferred        Extremities: Normal range of motion.  Edema: None  Mental Status: Normal mood and affect. Normal behavior. Normal judgment and thought content.   Assessment and Plan:  Pregnancy: G1P0000 at [redacted]w[redacted]d  1. Supervision of normal first pregnancy, antepartum Working 27-36 hours/wk Taking FeSo4 I daily Walking 2-3x/wk x 20 min Here with FOB Pap done today 1 hour glucola=73 on 07/07/21 - Urinalysis (Urine Dip) - Urine Culture  2. Urinary tract infection in mother during pregnancy, antepartum Dx'd at [facility] on  07/24/21 and began taking liquid Keflex I daily on 07/28/21--needs to take QID Also dx'd there with BV and given Metrogel cream   Preterm labor symptoms and general obstetric precautions including but not limited to vaginal bleeding, contractions, leaking of fluid and fetal movement were reviewed in detail with the patient. Please refer to After Visit Summary for other counseling recommendations.  Return in about 2 weeks (around 08/14/2021).  No future appointments.  Alberteen Spindle, CNM

## 2021-07-31 NOTE — Progress Notes (Addendum)
Patient reports she went to [facility] on 07/24/21 for cramping and discomfort. She was diagnosed with UTI and BV (see [facility] notes).   Patient picked up from pharmacy until 07/28/21 CephALEXin 250 mg/5 mL suspension. Patient states she was verbalized to take once daily. States she last took ABX yesterday and will complete treatment in about 2 days. Per verbal order of E. Sciora, CNM patient was educated to take Cephalexin 4 times a day as originally prescribed instead of once daily. Patient verbalized understanding.   Patient is also taking Metrogel vaginal gel to treat BV - last used gel last night.   Urine dip reviewed during clinic visit.   Floy Sabina, RN

## 2021-08-05 LAB — PAP IG (IMAGE GUIDED): PAP Smear Comment: 0

## 2021-08-14 ENCOUNTER — Other Ambulatory Visit: Payer: Self-pay

## 2021-08-14 ENCOUNTER — Ambulatory Visit: Payer: Medicaid Other | Admitting: Advanced Practice Midwife

## 2021-08-14 VITALS — BP 113/70 | HR 88 | Temp 96.5°F | Wt 167.2 lb

## 2021-08-14 DIAGNOSIS — O99012 Anemia complicating pregnancy, second trimester: Secondary | ICD-10-CM

## 2021-08-14 DIAGNOSIS — O99013 Anemia complicating pregnancy, third trimester: Secondary | ICD-10-CM

## 2021-08-14 DIAGNOSIS — O2340 Unspecified infection of urinary tract in pregnancy, unspecified trimester: Secondary | ICD-10-CM

## 2021-08-14 DIAGNOSIS — O2343 Unspecified infection of urinary tract in pregnancy, third trimester: Secondary | ICD-10-CM

## 2021-08-14 DIAGNOSIS — Z3400 Encounter for supervision of normal first pregnancy, unspecified trimester: Secondary | ICD-10-CM

## 2021-08-14 DIAGNOSIS — Z3403 Encounter for supervision of normal first pregnancy, third trimester: Secondary | ICD-10-CM

## 2021-08-14 LAB — HEMOGLOBIN, FINGERSTICK: Hemoglobin: 10.1 g/dL — ABNORMAL LOW (ref 11.1–15.9)

## 2021-08-14 NOTE — Progress Notes (Signed)
Hgb 10.1 today. Patient counseled to continue taking iron 3 times daily with vitamin C juice. Provider aware of results . Patient has only been taking iron 3 times daily for 2 weeks (since last visit) and sometimes takes it with water. Importance of taking iron with vitamin C juice stressed as the best way to get the benefits of oral iron. Needs recheck in 1 month. Burt Knack, RN

## 2021-08-14 NOTE — Progress Notes (Signed)
Patient here for MH RV at 32 2/7. Kick counts reviewed and cards given. Needs Hgb recheck today and urine TOC. Also, finished metrogel and states no vaginal symptoms.Burt Knack, RN

## 2021-08-14 NOTE — Progress Notes (Signed)
[place] [facility]  PRENATAL VISIT NOTE  Subjective:  Claudia Frederick is a 21 y.o. G1P0000 at [redacted]w[redacted]d being seen today for ongoing prenatal care.  She is currently monitored for the following issues for this low-risk pregnancy and has Anemia affecting pregnancy; Supervision of normal first pregnancy, antepartum; and UTI (urinary tract infection) during pregnancy dx'd [facility] 07/24/21 on their problem list.  Patient reports no complaints.  Contractions: Not present. Vag. Bleeding: None.  Movement: Present. Denies leaking of fluid/ROM.   The following portions of the patient's history were reviewed and updated as appropriate: allergies, current medications, past family history, past medical history, past social history, past surgical history and problem list. Problem list updated.  Objective:   Vitals:   08/14/21 0827  BP: 113/70  Pulse: 88  Temp: (!) 96.5 F (35.8 C)  Weight: 167 lb 3.2 oz (75.8 kg)    Fetal Status: Fetal Heart Rate (bpm): 150 Fundal Height: 33 cm Movement: Present     General:  Alert, oriented and cooperative. Patient is in no acute distress.  Skin: Skin is warm and dry. No rash noted.   Cardiovascular: Normal heart rate noted  Respiratory: Normal respiratory effort, no problems with respiration noted  Abdomen: Soft, gravid, appropriate for gestational age.  Pain/Pressure: Absent     Pelvic: Cervical exam deferred        Extremities: Normal range of motion.  Edema: None  Mental Status: Normal mood and affect. Normal behavior. Normal judgment and thought content.   Assessment and Plan:  Pregnancy: G1P0000 at [redacted]w[redacted]d  1. Supervision of normal first pregnancy, antepartum 33 lb 3.2 oz (15.1 kg) 5 lb wt gain in past 2 wks; walking 2-3x/wk x 15-30 min Living with FOB, her parents and 70 yo sister. Working 40 hrs/wk Has car seat and preparing for baby at home - Hemoglobin, fingerstick - Urine Culture  2. Anemia during  pregnancy in second trimester Now taking FeSo4 TID with oj - Hemoglobin, fingerstick  3. Urinary tract infection in mother during pregnancy, antepartum Was taking Keflex once a day until last apt when instructed to take q 6 hours. Finished Keflex on 08/04/21. TOC C&S today - Urine Culture   Preterm labor symptoms and general obstetric precautions including but not limited to vaginal bleeding, contractions, leaking of fluid and fetal movement were reviewed in detail with the patient. Please refer to After Visit Summary for other counseling recommendations.  Return in about 2 weeks (around 08/28/2021) for routine PNC.  No future appointments.  Herbie Saxon, CNM

## 2021-08-16 LAB — URINE CULTURE: Organism ID, Bacteria: NO GROWTH

## 2021-08-27 ENCOUNTER — Other Ambulatory Visit: Payer: Self-pay

## 2021-08-27 ENCOUNTER — Ambulatory Visit: Payer: Medicaid Other | Admitting: Physician Assistant

## 2021-08-27 ENCOUNTER — Encounter: Payer: Self-pay | Admitting: Physician Assistant

## 2021-08-27 VITALS — BP 111/74 | HR 89 | Temp 97.5°F | Wt 172.0 lb

## 2021-08-27 DIAGNOSIS — Z3400 Encounter for supervision of normal first pregnancy, unspecified trimester: Secondary | ICD-10-CM

## 2021-08-27 DIAGNOSIS — O99013 Anemia complicating pregnancy, third trimester: Secondary | ICD-10-CM

## 2021-08-27 MED ORDER — FERROUS SULFATE 324 (65 FE) MG PO TBEC: 325.0000 mg | DELAYED_RELEASE_TABLET | Freq: Every day | ORAL | 0 refills | Status: DC

## 2021-08-27 NOTE — Progress Notes (Signed)
[facility]  PRENATAL VISIT NOTE  Subjective:  Claudia Frederick is a 21 y.o. G1P0000 at [redacted]w[redacted]d being seen today for ongoing prenatal care.  She is currently monitored for the following issues for this low-risk pregnancy and has Anemia affecting pregnancy; Supervision of normal first pregnancy, antepartum; and UTI (urinary tract infection) during pregnancy dx'd [facility] 07/24/21 on their problem list.  Patient reports no complaints.  Contractions: Not present. Vag. Bleeding: None.  Movement: Present. Denies leaking of fluid/ROM.   The following portions of the patient's history were reviewed and updated as appropriate: allergies, current medications, past family history, past medical history, past social history, past surgical history and problem list. Problem list updated.  Objective:   Vitals:   08/27/21 0954  BP: 111/74  Pulse: 89  Temp: (!) 97.5 F (36.4 C)  Weight: 172 lb (78 kg)    Fetal Status: Fetal Heart Rate (bpm): 154 Fundal Height: 35 cm Movement: Present     General:  Alert, oriented and cooperative. Patient is in no acute distress.  Skin: Skin is warm and dry. No rash noted.   Cardiovascular: Normal heart rate noted  Respiratory: Normal respiratory effort, no problems with respiration noted  Abdomen: Soft, gravid, appropriate for gestational age.  Pain/Pressure: Absent     Pelvic: Cervical exam deferred        Extremities: Normal range of motion.  Edema: None  Mental Status: Normal mood and affect. Normal behavior. Normal judgment and thought content.   Assessment and Plan:  Pregnancy: G1P0000 at [redacted]w[redacted]d  1. Supervision of normal first pregnancy, antepartum Doing well, continue current care. Anticipatory guidance re: 36-week labs and transition to weekly visits at next visit. - ferrous sulfate 324 (65 Fe) MG TBEC; Take 1 tablet (325 mg total) by mouth daily.  Dispense: 100 tablet; Refill: 0  2. Anemia affecting  pregnancy in third trimester Continue oral iron supplement, more tabs provided today. - ferrous sulfate 324 (65 Fe) MG TBEC; Take 1 tablet (325 mg total) by mouth daily.  Dispense: 100 tablet; Refill: 0  Preterm labor symptoms and general obstetric precautions including but not limited to vaginal bleeding, contractions, leaking of fluid and fetal movement were reviewed in detail with the patient. Please refer to After Visit Summary for other counseling recommendations.  Return in about 2 weeks (around 09/10/2021) for Routine prenatal care, 36 wk labs.  Future Appointments  Date Time Provider Department Center  09/10/2021  9:00 AM [facility] PROVIDER [facility] None    Landry Dyke, PA-C

## 2021-08-27 NOTE — Progress Notes (Signed)
Patient here for MH RV at 34 1/7. States she ran out of iron tablets on Sunday, needs more today. Jenetta Downer, RN

## 2021-08-27 NOTE — Progress Notes (Signed)
Iron tablets given today.Burt Knack, RN

## 2021-09-10 ENCOUNTER — Other Ambulatory Visit: Payer: Self-pay

## 2021-09-10 ENCOUNTER — Encounter: Payer: Self-pay | Admitting: Nurse Practitioner

## 2021-09-10 ENCOUNTER — Ambulatory Visit: Payer: Medicaid Other | Admitting: Nurse Practitioner

## 2021-09-10 VITALS — BP 103/62 | HR 84 | Wt 173.0 lb

## 2021-09-10 DIAGNOSIS — Z3400 Encounter for supervision of normal first pregnancy, unspecified trimester: Secondary | ICD-10-CM

## 2021-09-10 DIAGNOSIS — Z3403 Encounter for supervision of normal first pregnancy, third trimester: Secondary | ICD-10-CM | POA: Diagnosis not present

## 2021-09-10 DIAGNOSIS — O99013 Anemia complicating pregnancy, third trimester: Secondary | ICD-10-CM

## 2021-09-10 LAB — HEMOGLOBIN, FINGERSTICK: Hemoglobin: 10.6 g/dL — ABNORMAL LOW (ref 11.1–15.9)

## 2021-09-10 NOTE — Progress Notes (Addendum)
[facility]  PRENATAL VISIT NOTE  Subjective:  Claudia Frederick is a 21 y.o. G1P0000 at [redacted]w[redacted]d being seen today for ongoing prenatal care.  She is currently monitored for the following issues for this low-risk pregnancy and has Anemia affecting pregnancy; Supervision of normal first pregnancy, antepartum; and UTI (urinary tract infection) during pregnancy dx'd [facility] 07/24/21 on their problem list.  Patient reports  that while doing activities and walking she feels a sharp/poking sensation and back pain when lying down .  Contractions: Not present. Vag. Bleeding: None.  Movement: Present. Denies leaking of fluid/ROM.   The following portions of the patient's history were reviewed and updated as appropriate: allergies, current medications, past family history, past medical history, past social history, past surgical history and problem list. Problem list updated.  Objective:   Vitals:   09/10/21 0914  BP: 103/62  Pulse: 84  Weight: 173 lb (78.5 kg)    Fetal Status: Fetal Heart Rate (bpm): 140 Fundal Height: 36 cm Movement: Present  Presentation: Vertex  General:  Alert, oriented and cooperative. Patient is in no acute distress.  Skin: Skin is warm and dry. No rash noted.   Cardiovascular: Normal heart rate noted  Respiratory: Normal respiratory effort, no problems with respiration noted  Abdomen: Soft, gravid, appropriate for gestational age.  Pain/Pressure: Present     Pelvic: Cervical exam deferred        Extremities: Normal range of motion.  Edema: Trace  Mental Status: Normal mood and affect. Normal behavior. Normal judgment and thought content.   Assessment and Plan:  Pregnancy: G1P0000 at [redacted]w[redacted]d  1. Supervision of normal first pregnancy, antepartum -21 year old female in clinic today for her prenatal visit.   -Patient states she is taking her PNV daily. -Patient reports today that when walking or performing activities she feels  a sharp/poking sensation into her lower abdomen.  Patient reports that during rest the pain subsides.  Encouraged to continue with activities with frequent rest breaks and continue with drinking lots of water.  Presentation assessed today, baby is in vertex position.  Informed patient that the baby is more engaging into pelvis.  Patient also mentioned back pain when lying down.  Encouraged more pillows for back support. Will continue to monitor and notify if pain gets worse.   -Reviewed 36 week packet with patient and when to report to the hospital for delivery.  -Total weight gain = 39 lb (17.7 kg).  Gained 1 lb since last visit.  Encouraged patient to continue with well balanced diet, exercise, and monitoring of weight gain.    - Culture, beta strep (group b only) - Chlamydia/GC NAA, Confirmation   2. Anemia affecting pregnancy in third trimester -Patient states that she is taking her Iron 325 MG PO BID, separate from PNV and with juice and food. -Hemoglobin check today = 10.6, will continue with current medication regimen.  - Hemoglobin, fingerstick  Term labor symptoms and general obstetric precautions including but not limited to vaginal bleeding, contractions, leaking of fluid and fetal movement were reviewed in detail with the patient. Please refer to After Visit Summary for other counseling recommendations.   Return in about 1 week (around 09/17/2021) for Routine prenatal care visit.    Glenna Fellows, FNP

## 2021-09-10 NOTE — Progress Notes (Signed)
Hgb 10.6 today, patient counseled to continue taking 1 iron tablet daily with juice. Patient states understanding.Burt Knack, RN

## 2021-09-13 LAB — CHLAMYDIA/GC NAA, CONFIRMATION
Chlamydia trachomatis, NAA: NEGATIVE
Neisseria gonorrhoeae, NAA: NEGATIVE

## 2021-09-14 LAB — CULTURE, BETA STREP (GROUP B ONLY): Strep Gp B Culture: NEGATIVE

## 2021-09-18 ENCOUNTER — Ambulatory Visit: Payer: Medicaid Other | Admitting: Advanced Practice Midwife

## 2021-09-18 ENCOUNTER — Other Ambulatory Visit: Payer: Self-pay

## 2021-09-18 VITALS — BP 115/68 | HR 95 | Temp 97.5°F | Wt 173.6 lb

## 2021-09-18 DIAGNOSIS — O99013 Anemia complicating pregnancy, third trimester: Secondary | ICD-10-CM

## 2021-09-18 DIAGNOSIS — R87615 Unsatisfactory cytologic smear of cervix: Secondary | ICD-10-CM | POA: Insufficient documentation

## 2021-09-18 DIAGNOSIS — Z3400 Encounter for supervision of normal first pregnancy, unspecified trimester: Secondary | ICD-10-CM

## 2021-09-18 NOTE — Progress Notes (Signed)
[facility] Department ?Maternal Health Clinic ? ?PRENATAL VISIT NOTE ? ?Subjective:  ?Claudia Frederick is a 21 y.o. G1P0000 at [redacted]w[redacted]d being seen today for ongoing prenatal care.  She is currently monitored for the following issues for this low-risk pregnancy and has Anemia affecting pregnancy; Supervision of normal first pregnancy, antepartum; UTI (urinary tract infection) during pregnancy dx'd [facility] 07/24/21; and Unsatisfactory cervical Papanicolaou smear 07/31/21 on their problem list. ? ?Patient reports contractions since last night .  Contractions: Irregular. Vag. Bleeding: None.  Movement: Present. Denies leaking of fluid/ROM.  ? ?The following portions of the patient's history were reviewed and updated as appropriate: allergies, current medications, past family history, past medical history, past social history, past surgical history and problem list. Problem list updated. ? ?Objective:  ? ?Vitals:  ? 09/18/21 0905  ?BP: 115/68  ?Pulse: 95  ?Temp: (!) 97.5 ?F (36.4 ?C)  ?Weight: 173 lb 9.6 oz (78.7 kg)  ? ? ?Fetal Status: Fetal Heart Rate (bpm): 160 Fundal Height: 38 cm Movement: Present  Presentation: Vertex ? ?General:  Alert, oriented and cooperative. Patient is in no acute distress.  ?Skin: Skin is warm and dry. No rash noted.   ?Cardiovascular: Normal heart rate noted  ?Respiratory: Normal respiratory effort, no problems with respiration noted  ?Abdomen: Soft, gravid, appropriate for gestational age.  Pain/Pressure: Absent     ?Pelvic: Cervical exam performed Dilation: Closed Effacement (%): 50 Station: -3  ?Extremities: Normal range of motion.  Edema: Trace  ?Mental Status: Normal mood and affect. Normal behavior. Normal judgment and thought content.  ? ?Assessment and Plan:  ?Pregnancy: G1P0000 at [redacted]w[redacted]d ? ?1. Unsatisfactory cervical Papanicolaou smear ?Repeat pap today ?- Pap IG (Image Guided) ? ?2. Supervision of normal first pregnancy, antepartum ?C/o u/c's since last night and called  [facility] with back pain and vaginal pressure and told to wait at home; back labor with u/c's q 10 min--suggestions given ?V/e posterior, soft, closed, 50% ?Last day of work was yesterday ?Living with her parents, FOB, her 68 yo sister ?Knows when to go to L&D ?Has car seat and ready for baby at home ?Here with sister ?09/10/21 GC/Chlamydia/GBS neg ? ?3. Anemia affecting pregnancy in third trimester ?Taking FeSo4 TID with oj ? ? ?Preterm labor symptoms and general obstetric precautions including but not limited to vaginal bleeding, contractions, leaking of fluid and fetal movement were reviewed in detail with the patient. ?Please refer to After Visit Summary for other counseling recommendations.  ?No follow-ups on file. ? ?Future Appointments  ?Date Time Provider Department Center  ?09/24/2021  8:40 AM [facility] PROVIDER [facility] None  ? ? ?Alberteen Spindle, CNM ? ?

## 2021-09-18 NOTE — Progress Notes (Signed)
Patient still complaining of lower back pain/pressure. Patient states contractions are relieved. Miguel Rota ? ?

## 2021-09-23 LAB — PAP IG (IMAGE GUIDED): PAP Smear Comment: 0

## 2021-09-24 ENCOUNTER — Other Ambulatory Visit: Payer: Self-pay

## 2021-09-24 ENCOUNTER — Ambulatory Visit: Payer: Medicaid Other | Admitting: Physician Assistant

## 2021-09-24 ENCOUNTER — Encounter: Payer: Self-pay | Admitting: Physician Assistant

## 2021-09-24 VITALS — BP 108/66 | HR 82 | Temp 97.8°F | Wt 174.0 lb

## 2021-09-24 DIAGNOSIS — O99013 Anemia complicating pregnancy, third trimester: Secondary | ICD-10-CM

## 2021-09-24 DIAGNOSIS — Z3400 Encounter for supervision of normal first pregnancy, unspecified trimester: Secondary | ICD-10-CM

## 2021-09-24 NOTE — Progress Notes (Signed)
[facility] Department ?Maternal Health Clinic ? ?PRENATAL VISIT NOTE ? ?Subjective:  ?Claudia Frederick is a 21 y.o. G1P0000 at [redacted]w[redacted]d being seen with FOB (supportive) today for ongoing prenatal care.  She is currently monitored for the following issues for this low-risk pregnancy and has Anemia affecting pregnancy; Supervision of normal first pregnancy, antepartum; UTI (urinary tract infection) during pregnancy dx'd [facility] 07/24/21; and Unsatisfactory cervical Papanicolaou smear 07/31/21 on their problem list. ? ?Patient reports  occ cramp or low back discomfort .  Contractions: Not present. Vag. Bleeding: None.  Movement: Present. Denies leaking of fluid/ROM.  ? ?The following portions of the patient's history were reviewed and updated as appropriate: allergies, current medications, past family history, past medical history, past social history, past surgical history and problem list. Problem list updated. ? ?Objective:  ? ?Vitals:  ? 09/24/21 0855  ?BP: 108/66  ?Pulse: 82  ?Temp: 97.8 ?F (36.6 ?C)  ?Weight: 174 lb (78.9 kg)  ? ? ?Fetal Status: Fetal Heart Rate (bpm): 148 Fundal Height: 39 cm Movement: Present  Presentation: Vertex ? ?General:  Alert, oriented and cooperative. Patient is in no acute distress.  ?Skin: Skin is warm and dry. No rash noted.   ?Cardiovascular: Normal heart rate noted  ?Respiratory: Normal respiratory effort, no problems with respiration noted  ?Abdomen: Soft, gravid, appropriate for gestational age.  Pain/Pressure: Present     ?Pelvic: Cervical exam deferred        ?Extremities: Normal range of motion.  Edema: None  ?Mental Status: Normal mood and affect. Normal behavior. Normal judgment and thought content.  ? ?Assessment and Plan:  ?Pregnancy: G1P0000 at [redacted]w[redacted]d ? ?1. Supervision of normal first pregnancy, antepartum ?Reviewed pregnancy term status. Still deciding on name for son. Continue weekly visits until delivery. ? ?2. Anemia affecting pregnancy in third  trimester ?May decrease to once daily iron supplement with orange juice (most recent hgb 10.6 on 09/10/21). ? ? ?Term labor symptoms and general obstetric precautions including but not limited to vaginal bleeding, contractions, leaking of fluid and fetal movement were reviewed in detail with the patient. ?Please refer to After Visit Summary for other counseling recommendations.  ?Return in about 1 week (around 10/01/2021) for Routine prenatal care. ? ?Future Appointments  ?Date Time Provider Department Center  ?10/02/2021  8:20 AM [facility] PROVIDER [facility] None  ? ? ?Landry Dyke, PA-C ? ?

## 2021-09-24 NOTE — Progress Notes (Signed)
Patient here for MH RV at 38w 1d.  ? ?Patient states she will is taking iron BID with juice as well as PNV.  ? ?Floy Sabina, RN ? ?

## 2021-10-02 ENCOUNTER — Other Ambulatory Visit: Payer: Self-pay

## 2021-10-02 ENCOUNTER — Ambulatory Visit: Payer: Medicaid Other | Admitting: Nurse Practitioner

## 2021-10-02 ENCOUNTER — Encounter: Payer: Self-pay | Admitting: Nurse Practitioner

## 2021-10-02 VITALS — BP 119/79 | HR 94 | Temp 97.3°F | Wt 177.8 lb

## 2021-10-02 DIAGNOSIS — Z3403 Encounter for supervision of normal first pregnancy, third trimester: Secondary | ICD-10-CM

## 2021-10-02 DIAGNOSIS — Z3400 Encounter for supervision of normal first pregnancy, unspecified trimester: Secondary | ICD-10-CM

## 2021-10-02 DIAGNOSIS — O99013 Anemia complicating pregnancy, third trimester: Secondary | ICD-10-CM

## 2021-10-02 NOTE — Progress Notes (Signed)
[facility] Department ?Maternal Health Clinic ? ?PRENATAL VISIT NOTE ? ?Subjective:  ?Claudia Frederick is a 21 y.o. G1P0000 at 100w2d being seen today for ongoing prenatal care.  She is currently monitored for the following issues for this low-risk pregnancy and has Anemia affecting pregnancy; Supervision of normal first pregnancy, antepartum; UTI (urinary tract infection) during pregnancy dx'd [facility] 07/24/21; and Unsatisfactory cervical Papanicolaou smear 07/31/21 on their problem list. ? ?Patient reports no complaints.  Contractions: Not present. Vag. Bleeding: None.  Movement: Present. Denies leaking of fluid/ROM.  ? ?The following portions of the patient's history were reviewed and updated as appropriate: allergies, current medications, past family history, past medical history, past social history, past surgical history and problem list. Problem list updated. ? ?Objective:  ? ?Vitals:  ? 10/02/21 0811 10/02/21 0816  ?BP: 130/82 119/79  ?Pulse: 94 94  ?Temp: (!) 97.3 ?F (36.3 ?C) (!) 97.3 ?F (36.3 ?C)  ?Weight: 177 lb 12.8 oz (80.6 kg) 177 lb 12.8 oz (80.6 kg)  ? ? ?Fetal Status: Fetal Heart Rate (bpm): 150 Fundal Height: 39 cm Movement: Present  Presentation: Vertex ? ?General:  Alert, oriented and cooperative. Patient is in no acute distress.  ?Skin: Skin is warm and dry. No rash noted.   ?Cardiovascular: Normal heart rate noted  ?Respiratory: Normal respiratory effort, no problems with respiration noted  ?Abdomen: Soft, gravid, appropriate for gestational age.  Pain/Pressure: Absent     ?Pelvic: Cervical exam performed Dilation: Fingertip Effacement (%): 0 Station: -3  ?Extremities: Normal range of motion.  Edema: None  ?Mental Status: Normal mood and affect. Normal behavior. Normal judgment and thought content.  ? ?Assessment and Plan:  ?Pregnancy: G1P0000 at [redacted]w[redacted]d ? ?1. Supervision of normal first pregnancy, antepartum ?-21 year old female in clinic today for prenatal care. ?-Patient  states she is taking PNV daily. ?-IOL paperwork completed today.  Patient stated she desires to be induced at 40 weeks.   Discussion regarding the best, effective and earliest timeframe for induction is between 41-42 weeks if no medical risk was indicated.  Patient also provided methods to assist with stimulation of labor. ?-43 lb 12.8 oz (19.9 kg) ? ? ? ?2. Anemia affecting pregnancy in third trimester ?-Patient states she is taking her Iron daily separate from PNV daily.  Encouraged to continue with incorporating Iron rich foods.  ? ? ?Term labor symptoms and general obstetric precautions including but not limited to vaginal bleeding, contractions, leaking of fluid and fetal movement were reviewed in detail with the patient. ?Please refer to After Visit Summary for other counseling recommendations.  ? ?Return in about 1 week (around 10/09/2021) for Routine prenatal care visit. ? ? ?Future Appointments  ?Date Time Provider Department Center  ?10/09/2021  8:20 AM [facility] PROVIDER AC-[facility] None  ? ? ?Glenna Fellows, FNP ? ?

## 2021-10-02 NOTE — Progress Notes (Signed)
Patient here for MH RV at 39 2/7. Needs cervical check and [facility] IOL referral today.Burt Knack, RN  ?

## 2021-10-05 ENCOUNTER — Telehealth: Payer: Self-pay

## 2021-10-05 NOTE — Telephone Encounter (Signed)
TC to patient to inform of [facility] IOL on 10/14/2021. Patient to expect call from [facility] between 11am-2pm that day to let her know what time to arrive at [facility]. Patient states understanding and aware of MH RV appt on 10/09/2021.Burt Knack, RN  ?

## 2021-10-07 DIAGNOSIS — Z3403 Encounter for supervision of normal first pregnancy, third trimester: Secondary | ICD-10-CM | POA: Diagnosis not present

## 2021-10-09 ENCOUNTER — Ambulatory Visit: Payer: Medicaid Other

## 2021-11-06 ENCOUNTER — Telehealth: Payer: Self-pay | Admitting: Family Medicine

## 2021-11-06 NOTE — Telephone Encounter (Signed)
Scheduled pt for 11/18/21 for her PP appointment. ?

## 2021-11-18 ENCOUNTER — Ambulatory Visit: Payer: Medicaid Other | Admitting: Family Medicine

## 2021-11-18 ENCOUNTER — Encounter: Payer: Self-pay | Admitting: Family Medicine

## 2021-11-18 VITALS — BP 112/73 | Ht 63.0 in | Wt 148.6 lb

## 2021-11-18 DIAGNOSIS — Z3009 Encounter for other general counseling and advice on contraception: Secondary | ICD-10-CM

## 2021-11-18 DIAGNOSIS — O99012 Anemia complicating pregnancy, second trimester: Secondary | ICD-10-CM

## 2021-11-18 LAB — HEMOGLOBIN, FINGERSTICK: Hemoglobin: 10.6 g/dL — ABNORMAL LOW (ref 11.1–15.9)

## 2021-11-18 MED ORDER — NORETHINDRONE 0.35 MG PO TABS: 1.0000 | ORAL_TABLET | Freq: Every day | ORAL | 1 refills | Status: DC

## 2021-11-18 NOTE — Progress Notes (Signed)
[facility] Department ? ?Postpartum Exam ? ?Claudia Frederick is a 21 y.o. G66P1001 female who presents for a postpartum visit. She is 6 weeks postpartum following a normal spontaneous vaginal delivery.  I have fully reviewed the prenatal and intrapartum course. The delivery was at 40 gestational weeks.  Anesthesia:  IV analgesics  . Postpartum course has been well but having nausea. Baby is doing well adjusting well, having colick. Baby is feeding by both breast and bottle - gerber gentle  . Bleeding no bleeding. Bowel function is normal. Bladder function is normal. Patient is not sexually active. Contraception method is abstinence. Postpartum depression screening: negative. ? ? ?The pregnancy intention screening data noted above was reviewed. Potential methods of contraception were discussed. The patient elected to proceed with No data recorded. ? ? Edinburgh Postnatal Depression Scale - 11/18/21 1017   ? ?  ? Edinburgh Postnatal Depression Scale:  In the Past 7 Days  ? I have been able to laugh and see the funny side of things. 0   ? I have looked forward with enjoyment to things. 1   ? I have blamed myself unnecessarily when things went wrong. 0   ? I have been anxious or worried for no good reason. 0   ? I have felt scared or panicky for no good reason. 1   ? Things have been getting on top of me. 0   ? I have been so unhappy that I have had difficulty sleeping. 2   ? I have felt sad or miserable. 0   ? I have been so unhappy that I have been crying. 0   ? The thought of harming myself has occurred to me. 0   ? Edinburgh Postnatal Depression Scale Total 4   ? ?  ?  ? ?  ? ? ?Health Maintenance Due  ?Topic Date Due  ? HPV VACCINES (1 - 2-dose series) Never done  ? COVID-19 Vaccine (3 - Booster for Pfizer series) 05/05/2020  ? PAP SMEAR-Modifier  12/19/2021  ? ? ?The following portions of the patient's history were reviewed and updated as appropriate: allergies, current medications, past family  history, past medical history, past social history, past surgical history, and problem list. ? ?Review of Systems ?Pertinent items are noted in HPI. ? ?Objective:  ?BP 112/73   Ht 5\' 3"  (1.6 m)   Wt 148 lb 9.6 oz (67.4 kg)   LMP  (LMP Unknown)   Breastfeeding Yes   BMI 26.32 kg/m?   ? ?General:  alert, cooperative, appears stated age, and no distress  ? Breasts:  normal  ?Lungs: clear to auscultation bilaterally  ?Heart:  regular rate and rhythm, S1, S2 normal, no murmur, click, rub or gallop  ?Abdomen: soft, non-tender; bowel sounds normal; no masses,  no organomegaly   ?Wound No wound   ?GU exam:  normal  ?     ?Assessment:  ? ? 1. Anemia during pregnancy in second trimester ? ?- Hemoglobin, fingerstick ? ?2. Family planning ?Pt desires for nexplanon but unable to stay for appointment for placement today.   ?Pt given 1 pack of pills for bridge method.  Pt to schedule appointment.  ? ?- norethindrone (MICRONOR) 0.35 MG tablet; Take 1 tablet (0.35 mg total) by mouth daily.  Dispense: 28 tablet; Refill: 1 ? ? ? postpartum exam.  ? ?Plan:  ? ?Essential components of care per ACOG recommendations: ? ?1.  Mood and well being: Patient with negative depression  screening today. Reviewed local resources for support.  ?- Patient tobacco use? No.   ?- hx of drug use? No.   ? ?2. Infant care and feeding:  ?-Patient currently breastmilk feeding? Yes. Reviewed importance of draining breast regularly to support lactation.  ?-Social determinants of health (SDOH) reviewed in EPIC. No concerns. The following needs were identified none indicated  ? ?3. Sexuality, contraception and birth spacing ?- Patient does not want a pregnancy in the next year.  Desired family size is 2 children.  ?- Reviewed reproductive life planning. Reviewed options based on patient desire and reproductive life plan. Patient is interested in Hormonal Implant and Oral Contraceptive. This was provided to the patient today. Unable to stay for insertion   ? ?Risks, benefits, and typical effectiveness rates were reviewed.  Questions were answered.  Written information was also given to the patient to review.   ? ?The patient will follow up in  4 weeks for surveillance.  The patient was told to call with any further questions, or with any concerns about this method of contraception.  Emphasized use of condoms 100% of the time for STI prevention. ? ?Patient was not offered ECP based on no sex since before delivery,>6 weeks.  Patient was offered ECP. Reviewed options and patient desired No method of ECP, declined all   ? ?- Discussed birth spacing of 18 months ? ?4. Sleep and fatigue ?-Encouraged family/partner/community support of 4 hrs of uninterrupted sleep to help with mood and fatigue ? ?5. Physical Recovery  ?- Discussed patients delivery and complications. She describes her labor as bad.  "Only was check at 2 cm and had to get mother to get staff because no one would answer the call bell and when they did come she was 9 cm and ready to push.   ?- Patient had a Vaginal, no problems at delivery. Patient had a 2nd degree laceration. Perineal healing reviewed. Patient expressed understanding ?- Patient has urinary incontinence? No. ?- Patient is safe to resume physical and sexual activity ? ?6.  Health Maintenance ?- HM due items addressed Yes ?- Last pap smear No results found for: DIAGPAP Pap smear done at today's visit.  ?-Breast Cancer screening indicated? No.  ? ?7. Chronic Disease/Pregnancy Condition follow up: Anemia ? ?- PCP follow up ? ?Wendi Snipes, FNP ? ? ?

## 2021-11-18 NOTE — Progress Notes (Signed)
Patient here for PP appointment. SVD at [facility] on 10/07/2021. Patient is breastfeeding and feeding some formula. Interested in Nexplanon for [facility] and will come back for that appointment. Hgb 10.6 today and states she still has half of bottle of iron from pregnancy. Counseled to take 1 tablet daily with vitamin C rich juice. She will need Hgb recheck in one month.Burt Knack, RN  ?

## 2021-11-18 NOTE — Progress Notes (Signed)
Scheduled to return on 11/27/21 for Nexplanon insertion. Nexplanon consult done and patient to pick up a pack of Micronor to take until Nexplanon appt.  OC consent signed.Burt Knack, RN  ?

## 2021-11-27 ENCOUNTER — Ambulatory Visit (LOCAL_COMMUNITY_HEALTH_CENTER): Payer: Medicaid Other | Admitting: Advanced Practice Midwife

## 2021-11-27 ENCOUNTER — Ambulatory Visit: Payer: Medicaid Other

## 2021-11-27 VITALS — BP 113/75 | Ht 63.0 in | Wt 148.4 lb

## 2021-11-27 DIAGNOSIS — Z309 Encounter for contraceptive management, unspecified: Secondary | ICD-10-CM

## 2021-11-27 DIAGNOSIS — Z30017 Encounter for initial prescription of implantable subdermal contraceptive: Secondary | ICD-10-CM

## 2021-11-27 MED ORDER — ETONOGESTREL 68 MG ~~LOC~~ IMPL
68.0000 mg | DRUG_IMPLANT | Freq: Once | SUBCUTANEOUS | Status: AC
  Administered 2021-11-27: 68 mg via SUBCUTANEOUS

## 2021-11-27 NOTE — Progress Notes (Signed)
Pt here for Nexplanon insertion.  Counseling and consent forms signed. ?Provider inserted Nexplanon without any complications.  Berdie Ogren, RN ? ?

## 2021-11-27 NOTE — Progress Notes (Signed)
[place] [facility] ?[place] ?Main Number: [phone] ? ?Contraception/Family Planning VISIT ENCOUNTER NOTE ? ?Subjective:  ? Claudia Frederick is a 21 y.o. SHF G1P1001 female here for reproductive life counseling. The patient is currently using Abstinence to prevent pregnancy.  SVD at 40 wks M on 10/07/21. Her parents and siblings are helping her with baby. No menses pp. Breastfeeding 2x/day and bottlefeeding q 3 hours (4 oz). Did not take Micronor given at pp exam and has been abstinant. Last sex during pregnancy. Wants Nexplanon.  Last pap 09/18/21 neg ? ?The patient does not want a pregnancy in the next year.   ? ?Client states they are looking for the following:  High efficacy at preventing pregnancy ? ?Denies abnormal vaginal bleeding, discharge, pelvic pain, problems with intercourse or other gynecologic concerns.  ?  ?Gynecologic History ?No LMP recorded (lmp unknown). ? ?Health Maintenance Due  ?Topic Date Due  ? COVID-19 Vaccine (3 - Booster for Pfizer series) 05/05/2020  ? PAP SMEAR-Modifier  12/19/2021  ? ? ? ?The following portions of the patient's history were reviewed and updated as appropriate: allergies, current medications, past family history, past medical history, past social history, past surgical history and problem list. ? ?Review of Systems ?Pertinent items are noted in HPI. ?  ?Objective:  ?BP 113/75   Ht 5\' 3"  (1.6 m)   Wt 148 lb 6.4 oz (67.3 kg)   LMP  (LMP Unknown)   Breastfeeding Yes   BMI 26.29 kg/m?  ?Gen: well appearing, NAD ?HEENT: no scleral icterus ?CV: RR ?Lung: Normal WOB ?Ext: warm well perfused ? ? ? ?Assessment and Plan:  ? ?Need for emergency contraceptive care was assessed today. ? ?Last unprotected sex was:  during pregnancy ? ?Patient reported not meeting criteria.  Reviewed options and patient desired No method of ECP, declined all   ? ?Contraception counseling: Reviewed methods in a patient centered  fashion and used shared decision making with the patient. Utilized Upstream patient education tools as appropriate. ?The patient stated there goals and desires from a method are: High efficacy at preventing pregnancy ? ?We reviewed the following methods in detail based on patient preferences available included: ?Hormonal Implant ? ?Patient expressed they would like Hormonal Implant ? ?This was provided to the patient today.  if not why not clearly documented ? ?Risks, benefits, and typical effectiveness rates were reviewed.  Questions were answered.  Written information was also given to the patient to review.   ? ? ?  will follow up in  1 years for surveillance.   was told to call with any further questions, or with any concerns about this method of contraception or cycle control.  Emphasized use of condoms 100% of the time for STI prevention. ? ? ?1. Encounter for initial prescription of implantable subdermal contraceptive ?Nexplanon Insertion Procedure ?Patient identified, informed consent performed, consent signed.   Patient does understand that irregular bleeding is a very common side effect of this medication. She was advised to have backup contraception after placement. Patient was determined to meet WHO criteria for not being pregnant. Appropriate time out taken.  The insertion site was identified 8-10 cm (3-4 inches) from the medial epicondyle of the humerus and 3-5 cm (1.25-2 inches) posterior to (below) the sulcus (groove) between the biceps and triceps muscles of the patient's left  arm and marked.  Patient was prepped with alcohol swab and then injected with 3 ml of 1% lidocaine.  Arm was prepped with chlorhexidene, Nexplanon removed from packaging,  Device confirmed in needle, then inserted full length of needle and withdrawn per handbook instructions. Nexplanon was able to palpated in the patient's arm; patient palpated the insert herself. There was minimal blood loss.  Patient insertion site covered  with guaze and a pressure bandage to reduce any bruising.  The patient tolerated the procedure well and was given post procedure instructions.   ?- etonogestrel (NEXPLANON) implant 68 mg ?Nexplanon:  ? ?Counseled patient to take OTC analgesic starting as soon as lidocaine starts to wear off and take regularly for at least 48 hr to decrease discomfort.  Specifically to take with food or milk to decrease stomach upset and for IB 600 mg (3 tablets) every 6 hrs; IB 800 mg (4 tablets) every 8 hrs; or Aleve 2 tablets every 12 hrs. ?  ?  ? ?Please refer to After Visit Summary for other counseling recommendations.  ? ?Return in about 1 year (around 11/28/2022) for yearly physical exam. ? ?Herbie Saxon, CNM ?[place] ?

## 2022-03-08 ENCOUNTER — Encounter: Payer: Self-pay | Admitting: Advanced Practice Midwife

## 2022-03-08 ENCOUNTER — Ambulatory Visit (LOCAL_COMMUNITY_HEALTH_CENTER): Payer: Medicaid Other | Admitting: Advanced Practice Midwife

## 2022-03-08 VITALS — BP 106/73 | HR 69 | Temp 97.7°F | Wt 150.2 lb

## 2022-03-08 DIAGNOSIS — Z3009 Encounter for other general counseling and advice on contraception: Secondary | ICD-10-CM

## 2022-03-08 DIAGNOSIS — Z309 Encounter for contraceptive management, unspecified: Secondary | ICD-10-CM

## 2022-03-08 LAB — HEMOGLOBIN, FINGERSTICK: Hemoglobin: 12.3 g/dL (ref 11.1–15.9)

## 2022-03-08 NOTE — Progress Notes (Signed)
   [facility] problem visit  [facility]  Subjective:  Claudia Frederick is a 21 y.o. SHF G1P1 with SVD 10/07/21 and bottlefeeding q 2 hours (4 oz) being seen today for Nexplanon removal.  Chief Complaint  Patient presents with   [facility] ACUTE    Here for removal of Nexplanon due to dizzyness, weakness and spotting that started one month after Nexplanon placement.     HPI LMP 02/06/22. Last sex 03/06/22 without condom; with current partner x 3 years; 1 partner in last 3 mo. Doesn't want a pregnancy. Nexplanon inserted 11/27/21 and wants removed due to dizzyness and weakness onset 1 month after insertion as well as spotting x 2 mo. Hgb=10.6 on 12/07/21 and not taking Feso4.  Does the patient have a current or past history of drug use? No   No components found for: "HCV"]   Health Maintenance Due  Topic Date Due   COVID-19 Vaccine (3 - Pfizer series) 05/05/2020   PAP SMEAR-Modifier  12/19/2021   INFLUENZA VACCINE  02/16/2022    ROS  The following portions of the patient's history were reviewed and updated as appropriate: allergies, current medications, past family history, past medical history, past social history, past surgical history and problem list. Problem list updated.   See flowsheet for other program required questions.  Objective:   Vitals:   03/08/22 1003  BP: 106/73  Pulse: 69  Temp: 97.7 F (36.5 C)  Weight: 150 lb 3.2 oz (68.1 kg)    Physical Exam Vitals and nursing note reviewed.  Constitutional:      Appearance: Normal appearance. She is normal weight.  HENT:     Head: Normocephalic.  Eyes:     Conjunctiva/sclera: Conjunctivae normal.  Pulmonary:     Effort: Pulmonary effort is normal.  Musculoskeletal:        General: Normal range of motion.  Skin:    General: Skin is warm and dry.  Neurological:     Mental Status: She is alert. Mental status is at baseline.       Assessment and Plan:  Claudia Frederick is a 21 y.o. female presenting to the [facility] Department for a Women's Health problem visit  1. Family planning Counseled that sperm can live for 3-5 days in vagina/uterus and if we removed Nexplanon today and give DMPA which won't begin protecting her from pregnancy for 7 days, that there is a chance she could get pregnant. Pt desires to wait and return 03/19/22 for Nexplanon removal with no sex until then. Please assist pt with scheduling apt - Hemoglobin, venipuncture     No follow-ups on file.  Future Appointments  Date Time Provider Department Center  03/17/2022  8:20 AM [facility] PROVIDER [facility] None    Alberteen Spindle, CNM

## 2022-03-08 NOTE — Progress Notes (Signed)
In House Hgb WNL. Scheduled return appointment and reminder card given to patient. Shiela Mayer RN

## 2022-03-17 ENCOUNTER — Ambulatory Visit: Payer: Medicaid Other

## 2023-02-04 ENCOUNTER — Ambulatory Visit: Payer: Medicaid Other

## 2023-02-04 ENCOUNTER — Ambulatory Visit (LOCAL_COMMUNITY_HEALTH_CENTER): Payer: Medicaid Other | Admitting: Family Medicine

## 2023-02-04 ENCOUNTER — Encounter: Payer: Self-pay | Admitting: Family Medicine

## 2023-02-04 VITALS — BP 117/73 | HR 78 | Ht 63.0 in | Wt 165.0 lb

## 2023-02-04 DIAGNOSIS — Z0000 Encounter for general adult medical examination without abnormal findings: Secondary | ICD-10-CM

## 2023-02-04 DIAGNOSIS — Z3046 Encounter for surveillance of implantable subdermal contraceptive: Secondary | ICD-10-CM | POA: Diagnosis not present

## 2023-02-04 DIAGNOSIS — Z309 Encounter for contraceptive management, unspecified: Secondary | ICD-10-CM | POA: Diagnosis not present

## 2023-02-04 DIAGNOSIS — Z3009 Encounter for other general counseling and advice on contraception: Secondary | ICD-10-CM

## 2023-02-04 MED ORDER — ULIPRISTAL ACETATE 30 MG PO TABS
1.0000 | ORAL_TABLET | Freq: Once | ORAL | 0 refills | Status: AC
Start: 2023-02-04 — End: 2023-02-04

## 2023-02-04 MED ORDER — LEVONORGESTREL 1.5 MG PO TABS: 1.5000 mg | ORAL_TABLET | Freq: Once | ORAL | 0 refills | Status: DC

## 2023-02-04 MED ORDER — NORELGESTROMIN-ETH ESTRADIOL 150-35 MCG/24HR TD PTWK: 1.0000 | MEDICATED_PATCH | TRANSDERMAL | 13 refills | Status: DC

## 2023-02-04 NOTE — Progress Notes (Signed)
Pt is here for family planning visit.  Family planning packet reviewed and given to pt.   Condoms declined. Gaspar Garbe, RN

## 2023-02-04 NOTE — Progress Notes (Signed)
[facility] DEPARTMENT [facility] [address] Main Number: [phone]  Family Planning Visit- Repeat Yearly Visit  Subjective:  Claudia Frederick is a 22 y.o. G1P1001  being seen today for an annual wellness visit and to discuss contraception options.   The patient is currently using Hormonal Implant for pregnancy prevention. Patient does not want a pregnancy in the next year.    report they are looking for a method that provides High efficacy at preventing pregnancy   Patient has the following medical problems: does not have any active problems on file.  Chief Complaint  Patient presents with   Annual Exam    And nex removal wants to try the patch    Patient reports to clinic for Nexplanon removal. Has continued dizziness and headaches which she attributes to the Nexplanon. Presented last Sept for removal but had recently had sex- and was concerned about getting pregnant with removal.   Patient denies other concerns about self   See flowsheet for other program required questions.   Body mass index is 29.23 kg/m. - Patient is eligible for diabetes screening based on BMI> 25 and age >35?  no HA1C ordered? not applicable  Patient reports 1 of partners in last year. Desires STI screening?  No - declined   Has patient been screened once for HCV in the past?  No  No results found for: "HCVAB"  Does the patient have current of drug use, have a partner with drug use, and/or has been incarcerated since last result? No  If yes-- Screen for HCV through [facility]   Does the patient meet criteria for HBV testing? No  Criteria:  -Household, sexual or needle sharing contact with HBV -History of drug use -HIV positive -Those with known Hep C   Health Maintenance Due  Topic Date Due   PAP SMEAR-Modifier  12/19/2021   COVID-19 Vaccine (3 - 2023-24 season) 03/19/2022   CHLAMYDIA SCREENING  09/10/2022    Review of Systems   Constitutional:  Negative for weight loss.  Eyes:  Negative for blurred vision.  Respiratory:  Negative for cough and shortness of breath.   Cardiovascular:  Negative for claudication.  Gastrointestinal:  Negative for nausea.  Genitourinary:  Negative for dysuria and frequency.  Skin:  Negative for rash.  Neurological:  Negative for headaches.  Endo/Heme/Allergies:  Does not bruise/bleed easily.    The following portions of the patient's history were reviewed and updated as appropriate: allergies, current medications, past family history, past medical history, past social history, past surgical history and problem list. Problem list updated.  Objective:   Vitals:   02/04/23 0820  BP: 117/73  Pulse: 78  Weight: 165 lb (74.8 kg)  Height: 5\' 3"  (1.6 m)    Physical Exam Constitutional:      Appearance: Normal appearance.  HENT:     Head: Normocephalic and atraumatic.  Pulmonary:     Effort: Pulmonary effort is normal.  Abdominal:     Palpations: Abdomen is soft.  Musculoskeletal:        General: Normal range of motion.  Skin:    General: Skin is warm and dry.  Neurological:     General: No focal deficit present.     Mental Status: She is alert.  Psychiatric:        Mood and Affect: Mood normal.        Behavior: Behavior normal.       Assessment and Plan:  Claudia Frederick is a 22 y.o. female G1P1001 presenting to the [facility] Department for an yearly wellness and contraception visit  1. Well woman exam (no gynecological exam) -pap UTD- needs another pap in 2026 -CBE not indicated until 25 per ACOG guidelines -strongly desires Nexplanon removal   2. Encounter for Nexplanon removal Nexplanon Removal Patient identified, informed consent performed, consent signed.   Appropriate time out taken. Nexplanon site identified.  Area prepped in usual sterile fashon. 3 ml of 1% lidocaine with Epinephrine was used to anesthetize the area at the distal  end of the implant and along implant site. A small stab incision was made right beside the implant on the distal portion.  The Nexplanon rod was grasped using hemostats and removed without difficulty.  There was minimal blood loss. There were no complications.  Steri-strips were applied over the small incision.  A pressure bandage was applied to reduce any bruising.  The patient tolerated the procedure well and was given post procedure instructions.   Nexplanon:   Counseled patient to take OTC analgesic starting as soon as lidocaine starts to wear off and take regularly for at least 48 hr to decrease discomfort.  Specifically to take with food or milk to decrease stomach upset and for IB 600 mg (3 tablets) every 6 hrs; IB 800 mg (4 tablets) every 8 hrs; or Aleve 2 tablets every 12 hrs.     3. Family planning Contraception counseling: Reviewed options based on patient desire and reproductive life plan. Patient is interested in Contraceptive Patch. This was provided to the patient today.   Risks, benefits, and typical effectiveness rates were reviewed.  Questions were answered.  Written information was also given to the patient to review.    The patient will follow up in  1 years for surveillance.  The patient was told to call with any further questions, or with any concerns about this method of contraception.  Emphasized use of condoms 100% of the time for STI prevention.  Educated on ECP and assessed need for ECP. Patient was offered ECP based on Unprotected sex within past 120 hours.  Patient is within 4 days of unprotected sex. Patient was offered ECP. Reviewed options and patient desired Ella (Ulipristal)   - norelgestromin-ethinyl estradiol Burr Medico) 150-35 MCG/24HR transdermal patch; Place 1 patch onto the skin once a week.  Dispense: 3 patch; Refill: 13 - ulipristal acetate (ELLA) 30 MG tablet; Take 1 tablet (30 mg total) by mouth once for 1 dose.  Dispense: 1 tablet; Refill: 0     Return  in about 1 year (around 02/04/2024) for annual well-woman exam.  No future appointments.  Lenice Llamas, [place]

## 2023-04-23 IMAGING — US US PELVIS COMPLETE TRANSABD/TRANSVAG W DUPLEX
1 series · 13 of 25 positions shown · non-contrast
Comparison: None.

CLINICAL DATA: Left lower quadrant abdominal pain, LMP 10/30/2020

EXAM:
TRANSABDOMINAL AND TRANSVAGINAL ULTRASOUND OF PELVIS
DOPPLER ULTRASOUND OF OVARIES
TECHNIQUE: Both transabdominal and transvaginal ultrasound examinations of the
pelvis were performed. Transabdominal technique was performed for
global imaging of the pelvis including uterus, ovaries, adnexal
regions, and pelvic cul-de-sac.
It was necessary to proceed with endovaginal exam following the
transabdominal exam to visualize the endometrium and ovaries
bilaterally. Color and duplex Doppler ultrasound was utilized to
evaluate blood flow to the ovaries.

[Series 1: us pelvic complete w transvaginal and torsion righ · 13 of 106 slices shown]
[im 1/106]
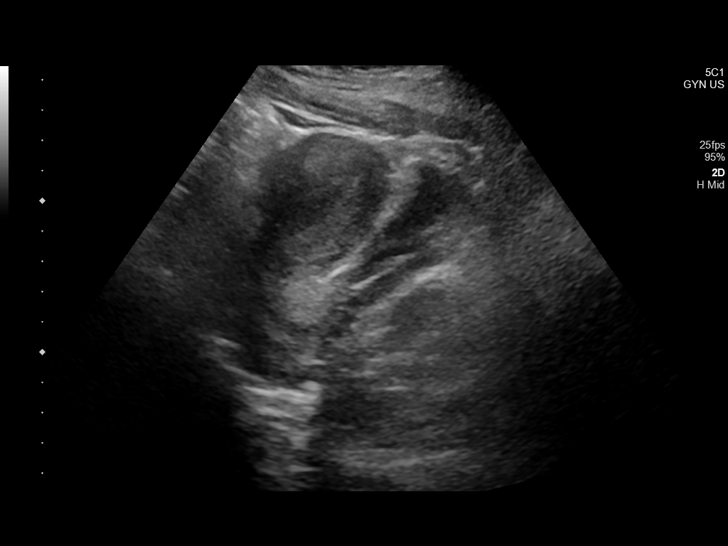
[im 9/106]
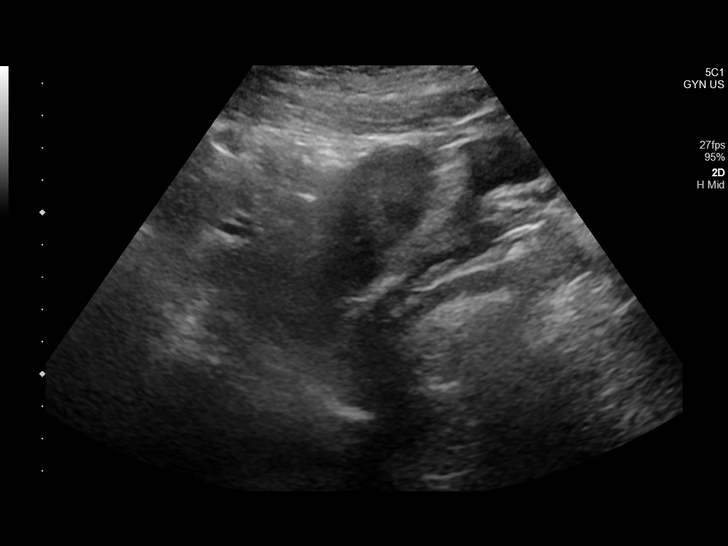
[im 18/106]
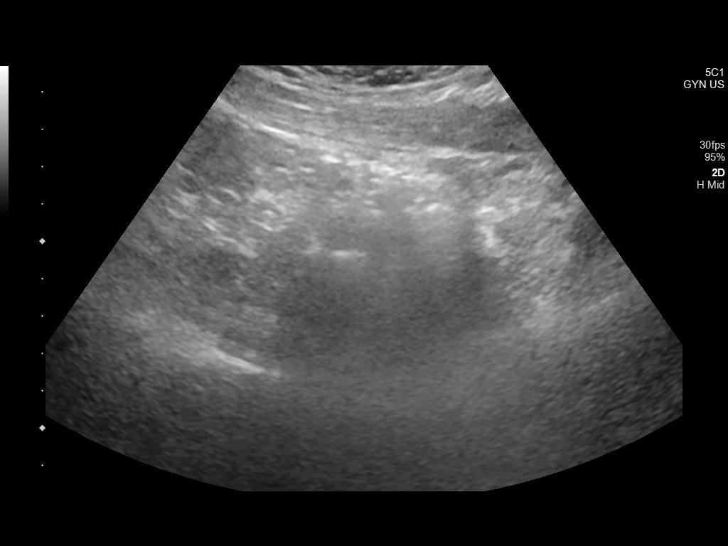
[im 27/106]
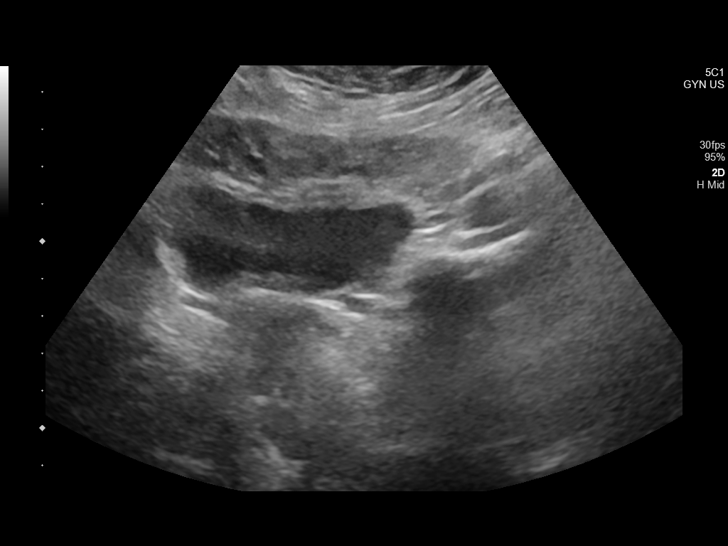
[im 36/106]
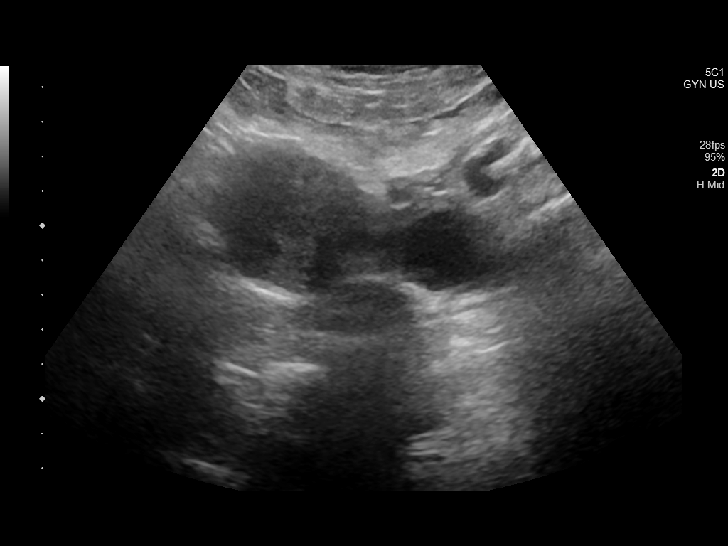
[im 44/106]
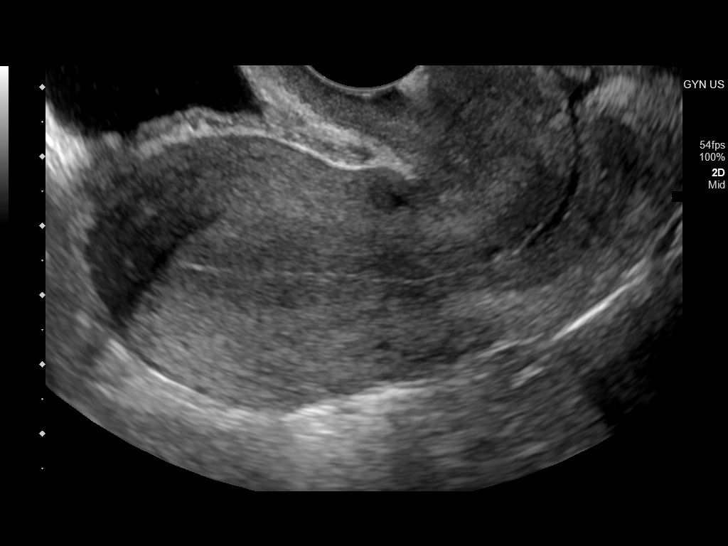
[im 53/106]
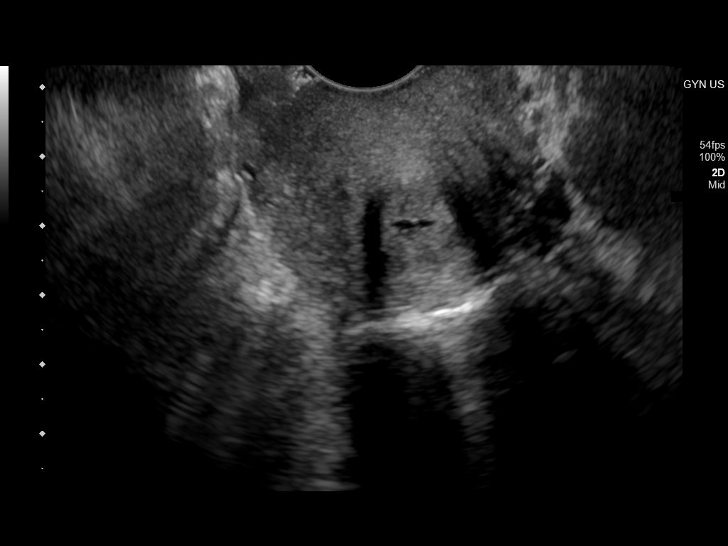
[im 62/106]
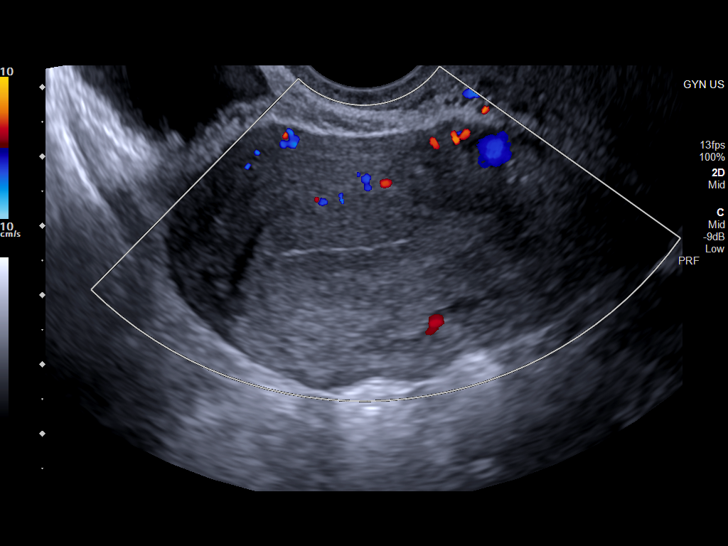
[im 71/106]
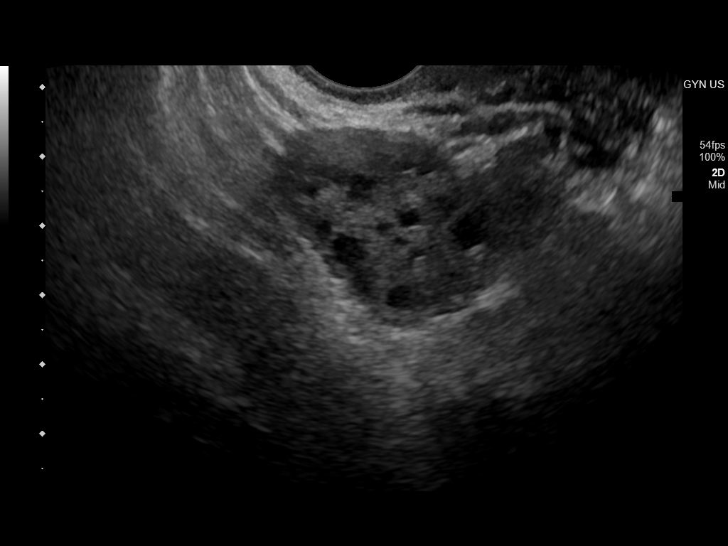
[im 79/106]
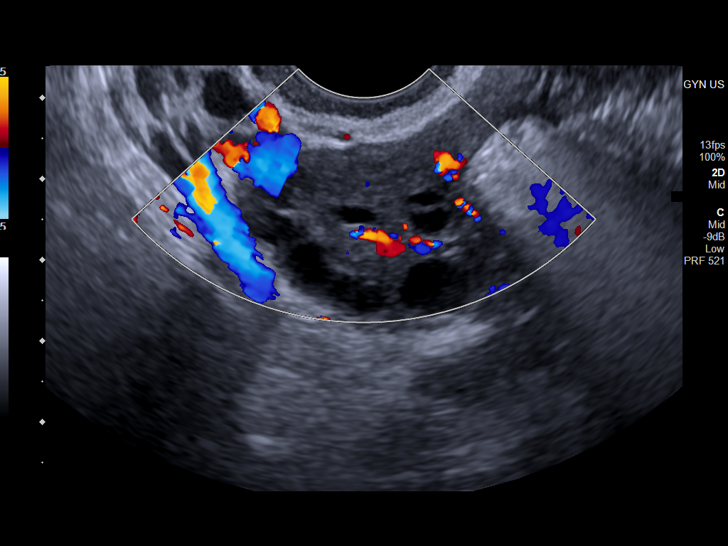
[im 88/106]
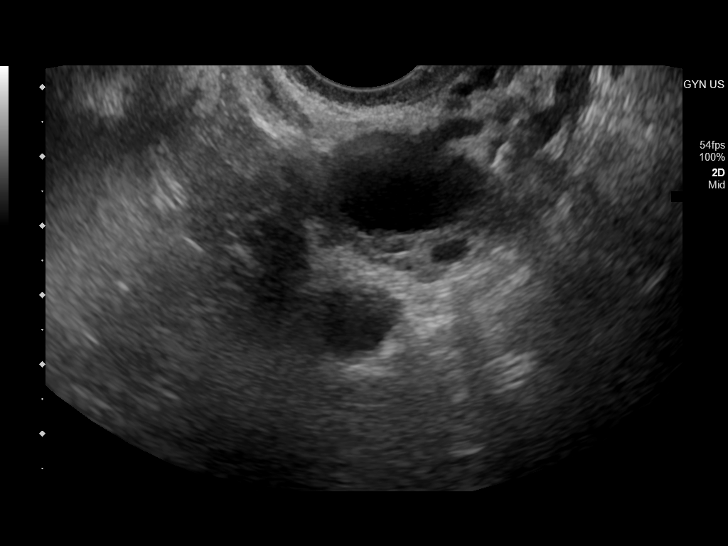
[im 97/106]
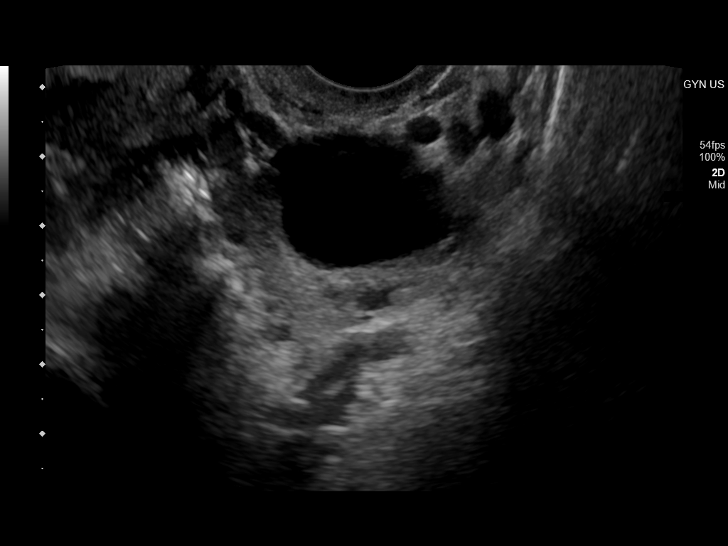
[im 106/106]
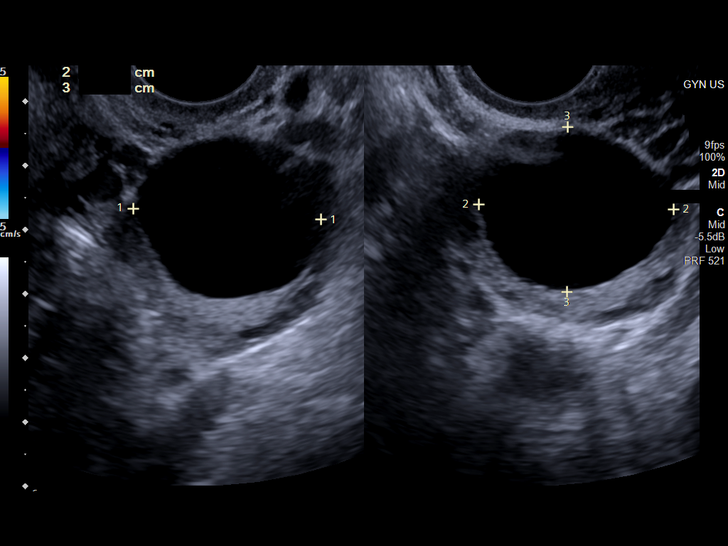

[13 of 25 positions shown; findings below may reference images not displayed]

FINDINGS: Uterus

Measurements: 7.3 x 3.8 x 4.9 cm = volume: 71 mL. No fibroids or
other mass visualized.

Endometrium

Thickness: 15 mm.  No focal abnormality visualized.

Right ovary

Measurements: 3.8 x 2.6 x 2.7 cm = volume: 14 mL. Normal
appearance/no adnexal mass.

Left ovary

Measurements: 4.0 x 3.2 x 3.7 cm = volume: 24 mL. a.m. 3.0 x 2.6 x
2.9 cm simple cyst is identified within the left ovary likely
representing a follicular cyst. No adnexal masses are seen.

Pulsed Doppler evaluation of both ovaries demonstrates normal
low-resistance arterial and venous waveforms.

Other findings

No abnormal free fluid.
IMPRESSION: 3 cm left ovarian simple cyst most in keeping with a follicular
cyst. No followup imaging recommended. Note: This recommendation
does not apply to premenarchal patients or to those with increased
risk (genetic, family history, elevated tumor markers or other
high-risk factors) of ovarian cancer. Reference: Radiology [DATE];

Otherwise normal pelvic sonogram

## 2023-12-15 ENCOUNTER — Ambulatory Visit: Admission: EM | Admit: 2023-12-15 | Discharge: 2023-12-15 | Disposition: A | Discharge status: Home / Self Care

## 2023-12-15 DIAGNOSIS — N3090 Cystitis, unspecified without hematuria: Secondary | ICD-10-CM | POA: Diagnosis present

## 2023-12-15 DIAGNOSIS — R110 Nausea: Secondary | ICD-10-CM | POA: Diagnosis not present

## 2023-12-15 LAB — URINALYSIS, W/ REFLEX TO CULTURE (INFECTION SUSPECTED)
Bilirubin Urine: NEGATIVE
Glucose, UA: NEGATIVE mg/dL
Hgb urine dipstick: NEGATIVE
Leukocytes,Ua: NEGATIVE
Nitrite: NEGATIVE
Protein, ur: NEGATIVE mg/dL
RBC / HPF: NONE SEEN RBC/hpf (ref 0–5)
Specific Gravity, Urine: 1.025 (ref 1.005–1.030)
WBC, UA: NONE SEEN WBC/hpf (ref 0–5)
pH: 6 (ref 5.0–8.0)

## 2023-12-15 LAB — PREGNANCY, URINE: Preg Test, Ur: NEGATIVE

## 2023-12-15 MED ORDER — ONDANSETRON 4 MG PO TBDP
4.0000 mg | ORAL_TABLET | Freq: Once | ORAL | Status: AC
  Administered 2023-12-15: 4 mg via ORAL

## 2023-12-15 MED ORDER — PHENAZOPYRIDINE HCL 200 MG PO TABS: 200.0000 mg | ORAL_TABLET | Freq: Three times a day (TID) | ORAL | 0 refills | Status: AC

## 2023-12-15 MED ORDER — ONDANSETRON 4 MG PO TBDP
4.0000 mg | ORAL_TABLET | Freq: Three times a day (TID) | ORAL | 0 refills | Status: AC | PRN
Start: 2023-12-15 — End: ?

## 2023-12-15 NOTE — ED Triage Notes (Signed)
 Pt c/o nausea,diarrhea & abd pain radiating to back x2 days. Denies any emesis. States able to keep foods & fluids down.

## 2023-12-15 NOTE — ED Provider Notes (Signed)
 [facility]  Note:  This document was prepared using Conservation officer, historic buildings and may include unintentional dictation errors.  MRN: [id number] DOB: Jan 14, 2001  Subjective:   Claudia Frederick is a 23 y.o. female presenting for nausea, diarrhea, abdominal cramping radiating to her back x 2 days.  Patient denies any vomiting but states she feels very nauseous all the time.  Patient has not had any difficulty keeping food or fluids down.  Patient reports multiple bouts of diarrhea daily has not taken any over-the-counter medication to treat symptoms.  No shortness of breath, chest pain, weakness, dizziness, vomiting, fever.  No current facility-administered medications for this encounter.  Current Outpatient Medications:    ondansetron  (ZOFRAN -ODT) 4 MG disintegrating tablet, Take 1 tablet (4 mg total) by mouth every 8 (eight) hours as needed for nausea or vomiting., Disp: 20 tablet, Rfl: 0   phenazopyridine  (PYRIDIUM ) 200 MG tablet, Take 1 tablet (200 mg total) by mouth 3 (three) times daily., Disp: 6 tablet, Rfl: 0   No Known Allergies  Past Medical History:  Diagnosis Date   Anemia affecting pregnancy 03/19/2021   Migraines    states hx of migraine headaches 2 years ago   Ovarian cyst    pt reports currently has left ovarian cyst     Past Surgical History:  Procedure Laterality Date   NO PAST SURGERIES     no surgical history      Family History  Problem Relation Age of Onset   Asthma Mother    Migraines Mother    Ovarian cysts Mother    Anxiety disorder Father    Asthma Sister    Healthy Brother    Healthy Brother    Healthy Maternal Grandmother    Diabetes Paternal Grandmother    Healthy Paternal Grandfather     Social History   Tobacco Use   Smoking status: Never    Passive exposure: Never   Smokeless tobacco: Never  Vaping Use   Vaping status: Never Used  Substance Use Topics   Alcohol use: Never   Drug use: Never    ROS Refer  to HPI for ROS details.  Objective:   Vitals: BP 121/81 (BP Location: Left Arm)   Pulse 68   Temp 98.5 F (36.9 C) (Oral)   Resp 16   Ht 5\' 3"  (1.6 m)   Wt 170 lb (77.1 kg)   LMP 12/09/2023 (Exact Date)   SpO2 98%   BMI 30.11 kg/m   Physical Exam Vitals and nursing note reviewed.  Constitutional:      General: She is not in acute distress.    Appearance: She is well-developed. She is not ill-appearing or toxic-appearing.  HENT:     Head: Normocephalic and atraumatic.     Mouth/Throat:     Mouth: Mucous membranes are moist.  Cardiovascular:     Rate and Rhythm: Normal rate.  Pulmonary:     Effort: Pulmonary effort is normal. No respiratory distress.  Abdominal:     General: There is no distension.     Palpations: Abdomen is soft.     Tenderness: There is no abdominal tenderness. There is no right CVA tenderness, left CVA tenderness, guarding or rebound.  Skin:    General: Skin is warm and dry.  Neurological:     General: No focal deficit present.     Mental Status: She is alert and oriented to person, place, and time.  Psychiatric:        Mood  and Affect: Mood normal.        Behavior: Behavior normal.     Procedures  Results for orders placed or performed during the hospital encounter of 12/15/23 (from the past 24 hours)  Pregnancy, urine     Status: None   Collection Time: 12/15/23  3:16 PM  Result Value Ref Range   Preg Test, Ur NEGATIVE NEGATIVE  Urinalysis, w/ Reflex to Culture (Infection Suspected) -Urine, Clean Catch     Status: Abnormal   Collection Time: 12/15/23  3:16 PM  Result Value Ref Range   Specimen Source URINE, CLEAN CATCH    Color, Urine YELLOW YELLOW   APPearance CLEAR CLEAR   Specific Gravity, Urine 1.025 1.005 - 1.030   pH 6.0 5.0 - 8.0   Glucose, UA NEGATIVE NEGATIVE mg/dL   Hgb urine dipstick NEGATIVE NEGATIVE   Bilirubin Urine NEGATIVE NEGATIVE   Ketones, ur TRACE (A) NEGATIVE mg/dL   Protein, ur NEGATIVE NEGATIVE mg/dL   Nitrite  NEGATIVE NEGATIVE   Leukocytes,Ua NEGATIVE NEGATIVE   Squamous Epithelial / HPF 6-10 0 - 5 /HPF   WBC, UA NONE SEEN 0 - 5 WBC/hpf   RBC / HPF NONE SEEN 0 - 5 RBC/hpf   Bacteria, UA RARE (A) NONE SEEN   Mucus PRESENT     Assessment and Plan :     Discharge Instructions       1. Nausea (Primary) - ondansetron (ZOFRAN-ODT) disintegrating tablet 4 mg given in UC for acute nausea. - ondansetron (ZOFRAN-ODT) 4 MG disintegrating tablet; Take 1 tablet (4 mg total) by mouth every 8 (eight) hours as needed for nausea or vomiting.  Dispense: 20 tablet; Refill: 0  2. Cystitis - Pregnancy, urine completed in UC is negative - Urinalysis completed in UC shows some bacteria, no leukocytes, no nitrite, no blood.  These findings are possibly indicative of early UTI. - Urine Culture collected and sent to lab for further testing results should be available in 2 to 3 days. - Pyridium 200 mg tablet 3 times daily as needed for dysuria and cystitis symptoms.    Claudia Frederick   Claudia Frederick, [place], [place] 12/15/23 [phone]

## 2023-12-15 NOTE — Discharge Instructions (Signed)
  1. Nausea (Primary) - ondansetron (ZOFRAN-ODT) disintegrating tablet 4 mg given in UC for acute nausea. - ondansetron (ZOFRAN-ODT) 4 MG disintegrating tablet; Take 1 tablet (4 mg total) by mouth every 8 (eight) hours as needed for nausea or vomiting.  Dispense: 20 tablet; Refill: 0  2. Cystitis - Pregnancy, urine completed in UC is negative - Urinalysis completed in UC shows some bacteria, no leukocytes, no nitrite, no blood.  These findings are possibly indicative of early UTI. - Urine Culture collected and sent to lab for further testing results should be available in 2 to 3 days. - Pyridium 200 mg tablet 3 times daily as needed for dysuria and cystitis symptoms.

## 2023-12-16 ENCOUNTER — Telehealth: Payer: Self-pay | Admitting: Emergency Medicine

## 2023-12-16 ENCOUNTER — Ambulatory Visit: Payer: Self-pay

## 2023-12-16 LAB — URINE CULTURE: Special Requests: NORMAL

## 2023-12-19 ENCOUNTER — Ambulatory Visit (HOSPITAL_COMMUNITY): Payer: Self-pay

## 2024-02-06 ENCOUNTER — Ambulatory Visit (LOCAL_COMMUNITY_HEALTH_CENTER): Payer: Self-pay

## 2024-02-06 VITALS — BP 124/74 | Ht 63.0 in | Wt 164.0 lb

## 2024-02-06 DIAGNOSIS — Z309 Encounter for contraceptive management, unspecified: Secondary | ICD-10-CM

## 2024-02-06 DIAGNOSIS — Z3201 Encounter for pregnancy test, result positive: Secondary | ICD-10-CM

## 2024-02-06 LAB — PREGNANCY, URINE: Preg Test, Ur: POSITIVE — AB

## 2024-02-06 MED ORDER — PRENATAL 27-0.8 MG PO TABS: 1.0000 | ORAL_TABLET | Freq: Every day | ORAL | Status: AC

## 2024-02-06 NOTE — Progress Notes (Signed)
 UPT positive . Per pt has nto decided on where wants to go for prenatal care. Stated she was on birth control patches, but stopped using them in April and was trying to become pregnant. States she was taking OTC prenatal vitamins. Sent to clerical for eligibility .  The patient was dispensed PNV today. I provided counseling today regarding the medication. We discussed the medication, the side effects and when to call clinic. Patient given the opportunity to ask questions. Questions answered.
